# Patient Record
Sex: Male | Born: 1955 | Race: White | Hispanic: Yes | Marital: Single | Smoking: Former smoker
Health system: Southern US, Community
[De-identification: ages and names within clinical notes are randomized; demographics above are authoritative.]

## PROBLEM LIST (undated history)

## (undated) DIAGNOSIS — K219 Gastro-esophageal reflux disease without esophagitis: Secondary | ICD-10-CM

## (undated) HISTORY — PX: WRIST SURGERY: SHX841

---

## 2009-01-15 ENCOUNTER — Emergency Department (HOSPITAL_COMMUNITY): Admission: EM | Admit: 2009-01-15 | Discharge: 2009-01-15 | Payer: Self-pay | Admitting: Emergency Medicine

## 2009-01-19 ENCOUNTER — Emergency Department (HOSPITAL_COMMUNITY): Admission: EM | Admit: 2009-01-19 | Discharge: 2009-01-19 | Payer: Self-pay | Admitting: Emergency Medicine

## 2009-01-23 ENCOUNTER — Ambulatory Visit (HOSPITAL_BASED_OUTPATIENT_CLINIC_OR_DEPARTMENT_OTHER): Admission: RE | Admit: 2009-01-23 | Discharge: 2009-01-23 | Payer: Self-pay | Admitting: Orthopedic Surgery

## 2009-02-22 ENCOUNTER — Emergency Department (HOSPITAL_COMMUNITY): Admission: EM | Admit: 2009-02-22 | Discharge: 2009-02-22 | Payer: Self-pay | Admitting: Emergency Medicine

## 2010-07-11 LAB — POCT I-STAT, CHEM 8
BUN: 13 mg/dL (ref 6–23)
Calcium, Ion: 1.14 mmol/L (ref 1.12–1.32)
Glucose, Bld: 99 mg/dL (ref 70–99)
HCT: 43 % (ref 39.0–52.0)
Hemoglobin: 14.6 g/dL (ref 13.0–17.0)
TCO2: 23 mmol/L (ref 0–100)

## 2010-07-11 LAB — CBC
HCT: 41.8 % (ref 39.0–52.0)
Platelets: 143 10*3/uL — ABNORMAL LOW (ref 150–400)
WBC: 10.6 10*3/uL — ABNORMAL HIGH (ref 4.0–10.5)

## 2010-07-11 LAB — DIFFERENTIAL
Basophils Absolute: 0 10*3/uL (ref 0.0–0.1)
Eosinophils Absolute: 0.1 10*3/uL (ref 0.0–0.7)
Lymphocytes Relative: 23 % (ref 12–46)
Monocytes Absolute: 0.5 10*3/uL (ref 0.1–1.0)
Neutrophils Relative %: 71 % (ref 43–77)

## 2011-09-25 IMAGING — CT CT EXTREM UP W/O CM*L*
1 of 2 series · 5 of 14 positions shown, 7 images · non-contrast
Comparison: Left wrist radiographs done earlier the same date.

CLINICAL DATA: Fall today.  Evaluate distal radial fracture.

CT OF THE LEFT WRIST WITHOUT CONTRAST
TECHNIQUE: Multidetector CT imaging of the left wrist was
performed according to the standard protocol without intravenous
contrast. Multiplanar CT image reconstructions were also generated.

[Series 604: <mpr thick range(2)> · axial · 0.24mm/px · z∈[-138,-97]mm · 5 of 33 slices shown, 7 images]
[im 6/33  soft-tissue]
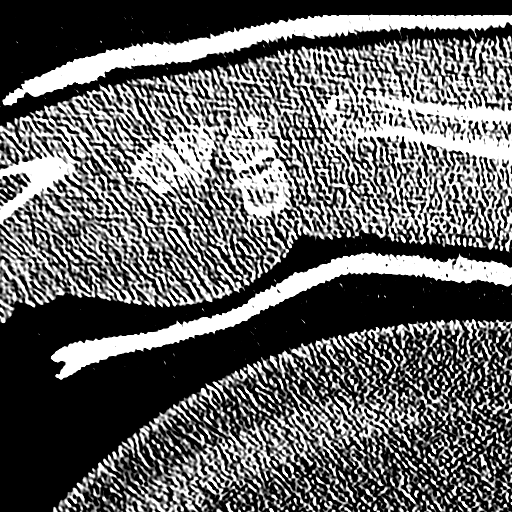
[im 6/33  bone]
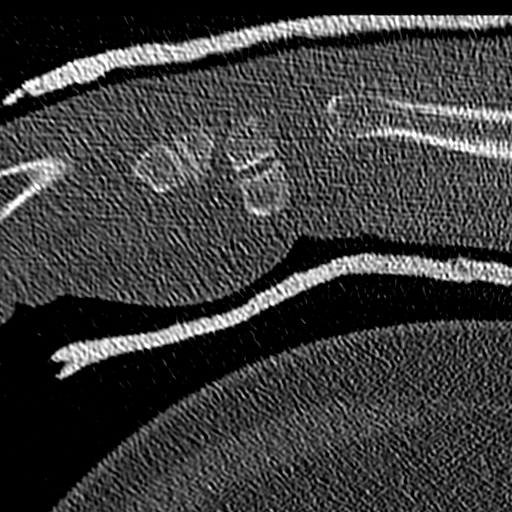
[im 11/33  bone]
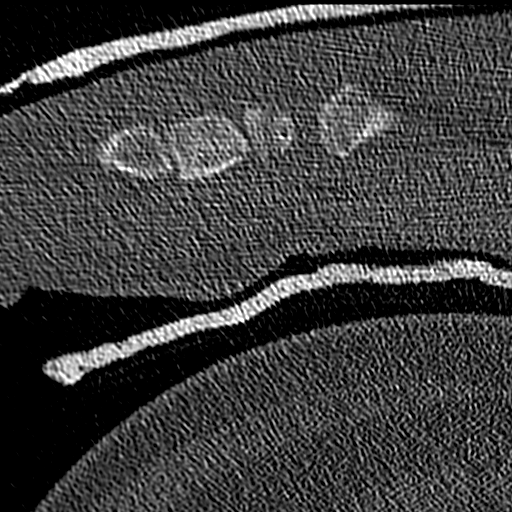
[im 17/33  bone]
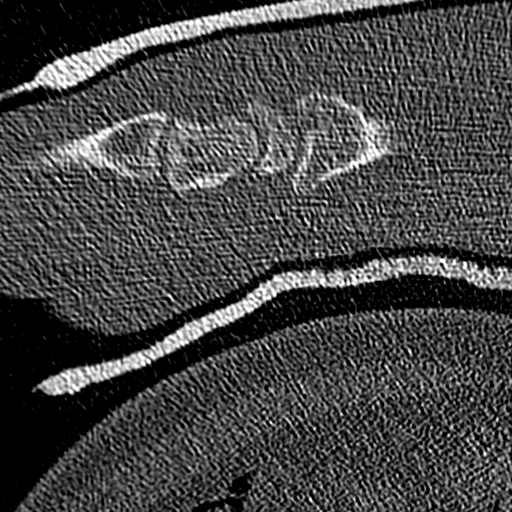
[im 22/33  bone]
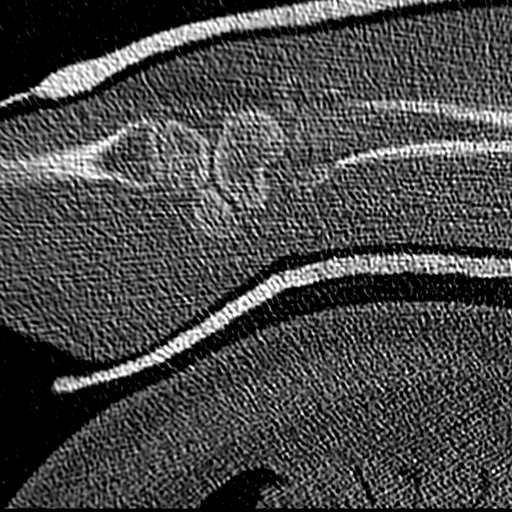
[im 27/33  soft-tissue]
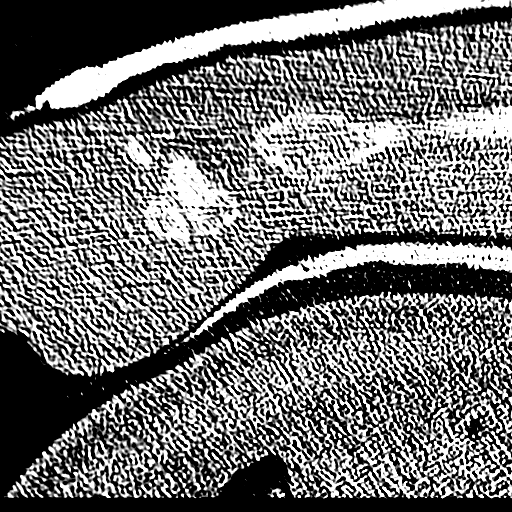
[im 27/33  bone]
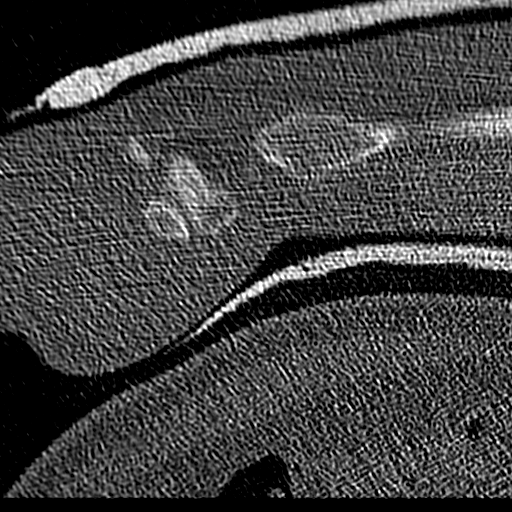

[5 of 14 positions shown; findings below may reference images not displayed]

FINDINGS: Study was initially performed with the patient's arm
resting over his upper abdomen.  Because those images were not
diagnostic, the patient was returned for a repeat study with the
patient prone and his arm properly positioned over his head.

A plaster splint has been applied.  There is a comminuted intra-
articular fracture of the distal radius with improved alignment
compared with the preceding radiographs.  The distal articular
surface of the radius demonstrates up to 3 mm of displacement at
the fracture.  Fracture extends into the distal radioulnar joint.
There is no significant widening of that joint.

Tiny ossific density is noted adjacent to the ulnar styloid which
may reflect a small avulsion fracture.  On the source images, there
is dorsal irregularity of the triquetrum suspicious for a small
avulsion fracture.  No other carpal bone fractures are identified.
IMPRESSION: 1.  Improved alignment of the intra-articular fracture of the
distal radius status post splinting.
2.  Possible small avulsion fractures from the dorsal aspect of the
triquetrum and the ulnar styloid.

## 2015-02-01 ENCOUNTER — Emergency Department (INDEPENDENT_AMBULATORY_CARE_PROVIDER_SITE_OTHER)
Admission: EM | Admit: 2015-02-01 | Discharge: 2015-02-01 | Disposition: A | Discharge status: Home / Self Care | Payer: Self-pay | Source: Home / Self Care | Attending: Emergency Medicine | Admitting: Emergency Medicine

## 2015-02-01 ENCOUNTER — Encounter (HOSPITAL_COMMUNITY): Payer: Self-pay | Admitting: Emergency Medicine

## 2015-02-01 DIAGNOSIS — K2970 Gastritis, unspecified, without bleeding: Secondary | ICD-10-CM

## 2015-02-01 DIAGNOSIS — K5901 Slow transit constipation: Secondary | ICD-10-CM

## 2015-02-01 MED ORDER — SUCRALFATE 1 G PO TABS: 1.0000 g | ORAL_TABLET | Freq: Three times a day (TID) | ORAL | Status: DC

## 2015-02-01 MED ORDER — OMEPRAZOLE 20 MG PO CPDR: 20.0000 mg | DELAYED_RELEASE_CAPSULE | Freq: Two times a day (BID) | ORAL | Status: DC

## 2015-02-01 MED ORDER — ONDANSETRON HCL 4 MG PO TABS: 4.0000 mg | ORAL_TABLET | Freq: Four times a day (QID) | ORAL | Status: DC

## 2015-02-01 NOTE — ED Notes (Signed)
Abdominal pain, intermittent for a week.  Pain is center epigastric area.  Patient has vomiting with pain, sometimes.  Patient reports a bm once a day.  Reports stools as sometimes hard and sometimes watery.  Denies urinary symptoms.

## 2015-02-01 NOTE — ED Provider Notes (Signed)
CSN: [id number]     Arrival date & time 02/01/15  1839 History   First MD Initiated Contact with Patient 02/01/15 1939     Chief Complaint  Patient presents with  . Abdominal Pain   (Consider location/radiation/quality/duration/timing/severity/associated sxs/prior Treatment) HPI Comments: 59 year old spanning male complaining of epigastric abdominal pain for one week. Pain is located in the mid epigastric region. It lasts between 15 and 30 minutes at a time. It is intermittent. It occurs approximately every 4-5 hours. Sometimes he has vomiting associated with the pain. Eating often makes it worse. Drinking milk often makes it better. He describes the pain as a burning type pain. He consumes beer 5-6 cans or bottles at a time however he only drinks once every 8-15 days or maybe 6 times a month.  Also complaining of constipation. Even though he had a small bowel movement today he has not had bowel movements other days. He has to strain and has small round hard stools.   History reviewed. No pertinent past medical history. History reviewed. No pertinent past surgical history. No family history on file. Social History  Substance Use Topics  . Smoking status: Current Every Day Smoker  . Smokeless tobacco: None  . Alcohol Use: No    Review of Systems  Constitutional: Negative for fever, activity change and fatigue.  HENT: Negative.   Respiratory: Negative.  Negative for cough and shortness of breath.   Cardiovascular: Negative for chest pain.  Gastrointestinal: Positive for nausea, vomiting, abdominal pain and constipation. Negative for diarrhea, blood in stool and anal bleeding.  Genitourinary: Negative.   Musculoskeletal: Negative.   Skin: Negative.   Neurological: Negative.   Psychiatric/Behavioral: Negative.     Allergies  Review of patient's allergies indicates no known allergies.  Home Medications   Prior to Admission medications   Medication Sig Start Date End Date Taking?  Authorizing Provider  omeprazole (PRILOSEC) 20 MG capsule Take 1 capsule (20 mg total) by mouth 2 (two) times daily. X 10 days 02/01/15   Hayden Rasmussenavid Zoiee Wimmer, NP  ondansetron (ZOFRAN) 4 MG tablet Take 1 tablet (4 mg total) by mouth every 6 (six) hours. 02/01/15   Hayden Rasmussenavid Kevon Tench, NP  sucralfate (CARAFATE) 1 G tablet Take 1 tablet (1 g total) by mouth 4 (four) times daily -  with meals and at bedtime. 02/01/15   Hayden Rasmussenavid Vallie Fayette, NP   Meds Ordered and Administered this Visit  Medications - No data to display  BP 110/78 mmHg  Pulse 75  Temp(Src) 98.7 F (37.1 C) (Oral)  Resp 20  SpO2 98% No data found.   Physical Exam  Constitutional: He is oriented to person, place, and time. He appears well-developed and well-nourished. No distress.  HENT:  Mouth/Throat: Oropharynx is clear and moist. No oropharyngeal exudate.  Eyes: EOM are normal.  Neck: Normal range of motion. Neck supple.  Cardiovascular: Normal rate.   Pulmonary/Chest: Effort normal and breath sounds normal. No respiratory distress.  Abdominal: Soft. Bowel sounds are normal. He exhibits no distension and no mass. There is tenderness. There is no rebound and no guarding.  Epigastric tenderness  Musculoskeletal: He exhibits no edema.  Lymphadenopathy:    He has no cervical adenopathy.  Neurological: He is alert and oriented to person, place, and time. No cranial nerve deficit. He exhibits normal muscle tone. Coordination normal.  Skin: Skin is warm and dry. No rash noted. No erythema.  Psychiatric: He has a normal mood and affect.  Nursing note and vitals reviewed.  ED Course  Procedures (including critical care time)  Labs Review Labs Reviewed - No data to display  Imaging Review No results found.   Visual Acuity Review  Right Eye Distance:   Left Eye Distance:   Bilateral Distance:    Right Eye Near:   Left Eye Near:    Bilateral Near:         MDM   1. Gastritis   2. Slow transit constipation   No evidence of acute  abdomen.  zofran for nausea carafate ac and hs Omeprazole 40 mg a day Zantac 150 mg q hs No ETOH No fried or spicey food for 1 week Miralax powder 1 capful in 6-8 oz water at night, every night until have good BM. Lots of fluids Increase fiber in diet     Hayden Rasmussen, NP 02/01/15 2004

## 2015-02-01 NOTE — Discharge Instructions (Signed)
Constipation, Adult Miralax powder 1 capful in 6-8 oz water at night, every night until have good BM. Lots of fluids Increase fiber in diet Constipation is when a person has fewer than three bowel movements a week, has difficulty having a bowel movement, or has stools that are dry, hard, or larger than normal. As people grow older, constipation is more common. A low-fiber diet, not taking in enough fluids, and taking certain medicines may make constipation worse.  CAUSES   Certain medicines, such as antidepressants, pain medicine, iron supplements, antacids, and water pills.   Certain diseases, such as diabetes, irritable bowel syndrome (IBS), thyroid disease, or depression.   Not drinking enough water.   Not eating enough fiber-rich foods.   Stress or travel.   Lack of physical activity or exercise.   Ignoring the urge to have a bowel movement.   Using laxatives too much.  SIGNS AND SYMPTOMS   Having fewer than three bowel movements a week.   Straining to have a bowel movement.   Having stools that are hard, dry, or larger than normal.   Feeling full or bloated.   Pain in the lower abdomen.   Not feeling relief after having a bowel movement.  DIAGNOSIS  Your health care provider will take a medical history and perform a physical exam. Further testing may be done for severe constipation. Some tests may include:  A barium enema X-ray to examine your rectum, colon, and, sometimes, your small intestine.   A sigmoidoscopy to examine your lower colon.   A colonoscopy to examine your entire colon. TREATMENT  Treatment will depend on the severity of your constipation and what is causing it. Some dietary treatments include drinking more fluids and eating more fiber-rich foods. Lifestyle treatments may include regular exercise. If these diet and lifestyle recommendations do not help, your health care provider may recommend taking over-the-counter laxative medicines  to help you have bowel movements. Prescription medicines may be prescribed if over-the-counter medicines do not work.  HOME CARE INSTRUCTIONS   Eat foods that have a lot of fiber, such as fruits, vegetables, whole grains, and beans.  Limit foods high in fat and processed sugars, such as french fries, hamburgers, cookies, candies, and soda.   A fiber supplement may be added to your diet if you cannot get enough fiber from foods.   Drink enough fluids to keep your urine clear or pale yellow.   Exercise regularly or as directed by your health care provider.   Go to the restroom when you have the urge to go. Do not hold it.   Only take over-the-counter or prescription medicines as directed by your health care provider. Do not take other medicines for constipation without talking to your health care provider first.  SEEK IMMEDIATE MEDICAL CARE IF:   You have bright red blood in your stool.   Your constipation lasts for more than 4 days or gets worse.   You have abdominal or rectal pain.   You have thin, pencil-like stools.   You have unexplained weight loss. MAKE SURE YOU:   Understand these instructions.  Will watch your condition.  Will get help right away if you are not doing well or get worse.   This information is not intended to replace advice given to you by your health care provider. Make sure you discuss any questions you have with your health care provider.   Document Released: 12/21/2003 Document Revised: 04/14/2014 Document Reviewed: 01/03/2013 Elsevier Interactive Patient Education 2016  Elsevier Inc.  Gastritis, Adult zofran for nausea carafate ac and hs Omeprazole 40 mg a day Zantac 150 mg q hs No ETOH No fried or spicey food for 1 week  Gastritis is soreness and swelling (inflammation) of the lining of the stomach. Gastritis can develop as a sudden onset (acute) or long-term (chronic) condition. If gastritis is not treated, it can lead to stomach  bleeding and ulcers. CAUSES  Gastritis occurs when the stomach lining is weak or damaged. Digestive juices from the stomach then inflame the weakened stomach lining. The stomach lining may be weak or damaged due to viral or bacterial infections. One common bacterial infection is the Helicobacter pylori infection. Gastritis can also result from excessive alcohol consumption, taking certain medicines, or having too much acid in the stomach.  SYMPTOMS  In some cases, there are no symptoms. When symptoms are present, they may include:  Pain or a burning sensation in the upper abdomen.  Nausea.  Vomiting.  An uncomfortable feeling of fullness after eating. DIAGNOSIS  Your caregiver may suspect you have gastritis based on your symptoms and a physical exam. To determine the cause of your gastritis, your caregiver may perform the following:  Blood or stool tests to check for the H pylori bacterium.  Gastroscopy. A thin, flexible tube (endoscope) is passed down the esophagus and into the stomach. The endoscope has a light and camera on the end. Your caregiver uses the endoscope to view the inside of the stomach.  Taking a tissue sample (biopsy) from the stomach to examine under a microscope. TREATMENT  Depending on the cause of your gastritis, medicines may be prescribed. If you have a bacterial infection, such as an H pylori infection, antibiotics may be given. If your gastritis is caused by too much acid in the stomach, H2 blockers or antacids may be given. Your caregiver may recommend that you stop taking aspirin, ibuprofen, or other nonsteroidal anti-inflammatory drugs (NSAIDs). HOME CARE INSTRUCTIONS  Only take over-the-counter or prescription medicines as directed by your caregiver.  If you were given antibiotic medicines, take them as directed. Finish them even if you start to feel better.  Drink enough fluids to keep your urine clear or pale yellow.  Avoid foods and drinks that make  your symptoms worse, such as:  Caffeine or alcoholic drinks.  Chocolate.  Peppermint or mint flavorings.  Garlic and onions.  Spicy foods.  Citrus fruits, such as oranges, lemons, or limes.  Tomato-based foods such as sauce, chili, salsa, and pizza.  Fried and fatty foods.  Eat small, frequent meals instead of large meals. SEEK IMMEDIATE MEDICAL CARE IF:   You have black or dark red stools.  You vomit blood or material that looks like coffee grounds.  You are unable to keep fluids down.  Your abdominal pain gets worse.  You have a fever.  You do not feel better after 1 week.  You have any other questions or concerns. MAKE SURE YOU:  Understand these instructions.  Will watch your condition.  Will get help right away if you are not doing well or get worse.   This information is not intended to replace advice given to you by your health care provider. Make sure you discuss any questions you have with your health care provider.   Document Released: 03/18/2001 Document Revised: 09/23/2011 Document Reviewed: 05/07/2011 Elsevier Interactive Patient Education 2016 Elsevier Inc.  High-Fiber Diet Fiber, also called dietary fiber, is a type of carbohydrate found in fruits, vegetables, whole  grains, and beans. A high-fiber diet can have many health benefits. Your health care provider may recommend a high-fiber diet to help:  Prevent constipation. Fiber can make your bowel movements more regular.  Lower your cholesterol.  Relieve hemorrhoids, uncomplicated diverticulosis, or irritable bowel syndrome.  Prevent overeating as part of a weight-loss plan.  Prevent heart disease, type 2 diabetes, and certain cancers. WHAT IS MY PLAN? The recommended daily intake of fiber includes:  38 grams for men under age 34.  30 grams for men over age 31.  25 grams for women under age 42.  21 grams for women over age 36. You can get the recommended daily intake of dietary fiber  by eating a variety of fruits, vegetables, grains, and beans. Your health care provider may also recommend a fiber supplement if it is not possible to get enough fiber through your diet. WHAT DO I NEED TO KNOW ABOUT A HIGH-FIBER DIET?  Fiber supplements have not been widely studied for their effectiveness, so it is better to get fiber through food sources.  Always check the fiber content on thenutrition facts label of any prepackaged food. Look for foods that contain at least 5 grams of fiber per serving.  Ask your dietitian if you have questions about specific foods that are related to your condition, especially if those foods are not listed in the following section.  Increase your daily fiber consumption gradually. Increasing your intake of dietary fiber too quickly may cause bloating, cramping, or gas.  Drink plenty of water. Water helps you to digest fiber. WHAT FOODS CAN I EAT? Grains Whole-grain breads. Multigrain cereal. Oats and oatmeal. Brown rice. Barley. Bulgur wheat. Millet. Bran muffins. Popcorn. Rye wafer crackers. Vegetables Sweet potatoes. Spinach. Kale. Artichokes. Cabbage. Broccoli. Green peas. Carrots. Squash. Fruits Berries. Pears. Apples. Oranges. Avocados. Prunes and raisins. Dried figs. Meats and Other Protein Sources Navy, kidney, pinto, and soy beans. Split peas. Lentils. Nuts and seeds. Dairy Fiber-fortified yogurt. Beverages Fiber-fortified soy milk. Fiber-fortified orange juice. Other Fiber bars. The items listed above may not be a complete list of recommended foods or beverages. Contact your dietitian for more options. WHAT FOODS ARE NOT RECOMMENDED? Grains White bread. Pasta made with refined flour. White rice. Vegetables Fried potatoes. Canned vegetables. Well-cooked vegetables.  Fruits Fruit juice. Cooked, strained fruit. Meats and Other Protein Sources Fatty cuts of meat. Fried Environmental education officer or fried fish. Dairy Milk. Yogurt. Cream cheese. Sour  cream. Beverages Soft drinks. Other Cakes and pastries. Butter and oils. The items listed above may not be a complete list of foods and beverages to avoid. Contact your dietitian for more information. WHAT ARE SOME TIPS FOR INCLUDING HIGH-FIBER FOODS IN MY DIET?  Eat a wide variety of high-fiber foods.  Make sure that half of all grains consumed each day are whole grains.  Replace breads and cereals made from refined flour or white flour with whole-grain breads and cereals.  Replace white rice with brown rice, bulgur wheat, or millet.  Start the day with a breakfast that is high in fiber, such as a cereal that contains at least 5 grams of fiber per serving.  Use beans in place of meat in soups, salads, or pasta.  Eat high-fiber snacks, such as berries, raw vegetables, nuts, or popcorn.   This information is not intended to replace advice given to you by your health care provider. Make sure you discuss any questions you have with your health care provider.   Document Released: 03/24/2005 Document Revised:  04/14/2014 Document Reviewed: 09/06/2013 Elsevier Interactive Patient Education [employer] Elsevier Inc.

## 2015-06-14 ENCOUNTER — Emergency Department (INDEPENDENT_AMBULATORY_CARE_PROVIDER_SITE_OTHER)
Admission: EM | Admit: 2015-06-14 | Discharge: 2015-06-14 | Disposition: A | Discharge status: Home / Self Care | Payer: Self-pay | Source: Home / Self Care | Attending: Emergency Medicine | Admitting: Emergency Medicine

## 2015-06-14 ENCOUNTER — Encounter (HOSPITAL_COMMUNITY): Payer: Self-pay | Admitting: Emergency Medicine

## 2015-06-14 DIAGNOSIS — K2970 Gastritis, unspecified, without bleeding: Principal | ICD-10-CM

## 2015-06-14 DIAGNOSIS — B9681 Helicobacter pylori [H. pylori] as the cause of diseases classified elsewhere: Secondary | ICD-10-CM

## 2015-06-14 DIAGNOSIS — K5900 Constipation, unspecified: Secondary | ICD-10-CM

## 2015-06-14 LAB — POCT I-STAT, CHEM 8
BUN: 9 mg/dL (ref 6–20)
CREATININE: 0.8 mg/dL (ref 0.61–1.24)
Calcium, Ion: 1.21 mmol/L (ref 1.12–1.23)
Chloride: 103 mmol/L (ref 101–111)
GLUCOSE: 99 mg/dL (ref 65–99)
HEMATOCRIT: 48 % (ref 39.0–52.0)
HEMOGLOBIN: 16.3 g/dL (ref 13.0–17.0)
POTASSIUM: 4.2 mmol/L (ref 3.5–5.1)
Sodium: 143 mmol/L (ref 135–145)
TCO2: 28 mmol/L (ref 0–100)

## 2015-06-14 LAB — POCT URINALYSIS DIP (DEVICE)
BILIRUBIN URINE: NEGATIVE
Glucose, UA: NEGATIVE mg/dL
HGB URINE DIPSTICK: NEGATIVE
Ketones, ur: NEGATIVE mg/dL
LEUKOCYTES UA: NEGATIVE
Nitrite: NEGATIVE
PH: 5.5 (ref 5.0–8.0)
Protein, ur: NEGATIVE mg/dL
Urobilinogen, UA: 0.2 mg/dL (ref 0.0–1.0)

## 2015-06-14 LAB — POCT H PYLORI SCREEN: H. PYLORI SCREEN, POC: POSITIVE — AB

## 2015-06-14 MED ORDER — CLARITHROMYCIN 500 MG PO TABS: 500.0000 mg | ORAL_TABLET | Freq: Two times a day (BID) | ORAL | Status: DC

## 2015-06-14 MED ORDER — AMOXICILLIN 500 MG PO CAPS: 1000.0000 mg | ORAL_CAPSULE | Freq: Two times a day (BID) | ORAL | Status: DC

## 2015-06-14 MED ORDER — DOCUSATE SODIUM 100 MG PO CAPS: 100.0000 mg | ORAL_CAPSULE | Freq: Every day | ORAL | Status: DC

## 2015-06-14 MED ORDER — OMEPRAZOLE 40 MG PO CPDR: 40.0000 mg | DELAYED_RELEASE_CAPSULE | Freq: Every day | ORAL | Status: DC

## 2015-06-14 NOTE — Discharge Instructions (Signed)
Your stomach pain is coming from an infection called H. Pylori. Take the omeprazole, amoxicillin, and clarithromycin as prescribed. You should see improvement when you finish the medicines.  Take Colace daily to help with your constipation. This is likely the reason you have some trouble with urinating.  Follow-up in 2 weeks if things are not improving.  Su dolor de Teaching laboratory technicianestmago viene de una infeccin llamada H. Pylori. Tome el omeprazol, la amoxicilina y Facilities managerla claritromicina segn lo prescrito. Usted debe ver una mejora cuando termine los medicamentos.  Tome Colace diariamente para ayudar con su estreimiento. Esta es probablemente la razn por la que tiene algunos problemas para Geographical information systems officerorinar.  Seguimiento en 2 semanas si las cosas no estn mejorando.

## 2015-06-14 NOTE — ED Provider Notes (Signed)
CSN: [id number]     Arrival date & time 06/14/15  1736 History   First MD Initiated Contact with Patient 06/14/15 1800     Chief Complaint  Patient presents with  . Abdominal Pain   (Consider location/radiation/quality/duration/timing/severity/associated sxs/prior Treatment) HPI  He is a 60 year old man here for evaluation of abdominal pain. The phone interpreter was used for this encounter. He states he is having epigastric abdominal pain. This comes and goes. Some days it'll be present for 6 hours, other days not at all. It tends to get worse with food. He does report some nausea and vomiting. He states the vomit is mostly yellow to greenish, but sometimes does have some red strings in it. He denies any diarrhea usually. He states he is more often constipated. He did take medication for constipation today which is causing him to have some diarrhea. He denies any blood in the stool or dark tarry stools. He does have a history of gastritis. He used to take medicine for his stomach, but he states it didn't help that much. He is not currently taking any medicines. No fevers, but he does report some fatigue.  History reviewed. No pertinent past medical history. History reviewed. No pertinent past surgical history. History reviewed. No pertinent family history. Social History  Substance Use Topics  . Smoking status: Current Every Day Smoker  . Smokeless tobacco: None  . Alcohol Use: No    Review of Systems As in history of present illness Also states he sometimes has difficulty urinating. Allergies  Review of patient's allergies indicates no known allergies.  Home Medications   Prior to Admission medications   Medication Sig Start Date End Date Taking? Authorizing Provider  amoxicillin (AMOXIL) 500 MG capsule Take 2 capsules (1,000 mg total) by mouth 2 (two) times daily. 06/14/15   Charm RingsErin J Jensyn Cambria, MD  clarithromycin (BIAXIN) 500 MG tablet Take 1 tablet (500 mg total) by mouth 2 (two) times  daily. 06/14/15   Charm RingsErin J Destyn Parfitt, MD  docusate sodium (COLACE) 100 MG capsule Take 1 capsule (100 mg total) by mouth daily. 06/14/15   Charm RingsErin J Tikisha Molinaro, MD  omeprazole (PRILOSEC) 40 MG capsule Take 1 capsule (40 mg total) by mouth daily. 06/14/15   Charm RingsErin J Simran Bomkamp, MD   Meds Ordered and Administered this Visit  Medications - No data to display  BP 101/73 mmHg  Pulse 71  Temp(Src) 98.4 F (36.9 C) (Oral)  Resp 16  SpO2 97% No data found.   Physical Exam  Constitutional: He is oriented to person, place, and time. He appears well-developed and well-nourished.  Neck: Neck supple.  Cardiovascular: Normal rate, regular rhythm and normal heart sounds.   No murmur heard. Pulmonary/Chest: Effort normal. No respiratory distress. He has no wheezes. He has no rales.  Abdominal: Soft. Bowel sounds are normal. He exhibits no distension and no mass. There is tenderness (in epigastric). There is no rebound and no guarding.  Genitourinary: Prostate normal.  He does have a small external hemorrhoid  Neurological: He is alert and oriented to person, place, and time.    ED Course  Procedures (including critical care time)  Labs Review Labs Reviewed  POCT H PYLORI SCREEN - Abnormal; Notable for the following:    H. PYLORI SCREEN, POC POSITIVE (*)    All other components within normal limits  POCT I-STAT, CHEM 8  POCT URINALYSIS DIP (DEVICE)    Imaging Review No results found.    MDM   1. Helicobacter pylori  gastritis   2. Constipation, unspecified constipation type    Treat with triple therapy. Colace for constipation. I suspect the constipation is leading to intermittent difficulties with urination. Follow-up if not improving in 2 weeks.    Charm Rings, MD 06/14/15 [phone]

## 2015-06-14 NOTE — ED Notes (Signed)
[facility] a [facility] right abdominal pain x 1 week.

## 2016-12-30 ENCOUNTER — Encounter (HOSPITAL_COMMUNITY): Payer: Self-pay | Admitting: Emergency Medicine

## 2016-12-30 ENCOUNTER — Ambulatory Visit (HOSPITAL_COMMUNITY)
Admission: EM | Admit: 2016-12-30 | Discharge: 2016-12-30 | Disposition: A | Discharge status: Home / Self Care | Payer: Self-pay | Attending: Internal Medicine | Admitting: Internal Medicine

## 2016-12-30 ENCOUNTER — Ambulatory Visit (INDEPENDENT_AMBULATORY_CARE_PROVIDER_SITE_OTHER): Payer: Self-pay

## 2016-12-30 DIAGNOSIS — K5900 Constipation, unspecified: Secondary | ICD-10-CM

## 2016-12-30 DIAGNOSIS — R1084 Generalized abdominal pain: Secondary | ICD-10-CM

## 2016-12-30 MED ORDER — DOCUSATE SODIUM 50 MG PO CAPS: 50.0000 mg | ORAL_CAPSULE | Freq: Two times a day (BID) | ORAL | 0 refills | Status: AC

## 2016-12-30 MED ORDER — POLYETHYLENE GLYCOL 3350 17 G PO PACK: 17.0000 g | PACK | Freq: Every day | ORAL | 0 refills | Status: DC

## 2016-12-30 NOTE — Discharge Instructions (Signed)
Start colace and miralax for constipation. Keep hydrated, you urine should be clear to pale yellow in color. Follow up with PCP for further evaluation and treatment of epigastric pain. You were tested positive 1 year ago for h pylori infection, PCP can help determine if this infection is active or not. Further evaluation, such as endoscopy, where they look into your stomach can also be scheduled by PCP if needed. You are also due for colonoscopy, which screens for colon cancer, and this can be scheduled by the PCP. If experiencing worsening symptoms, vomiting with blood, weakness, dizziness, go to the emergency department for further evaluation.

## 2016-12-30 NOTE — ED Provider Notes (Signed)
[facility]    CSN: [id number] Arrival date & time: 12/30/16  1010     History   Chief Complaint Chief Complaint  Patient presents with  . Abdominal Pain    HPI Justin Ballard is a 61 y.o. male.   61 year old male comes in for epigastric pain. He was treated for h pylori 1 year ago, which helped with the symptoms, but symptoms came back 8 days ago. He has been having epigastric abdominal pain, that radiates to the lower abdomen. Pain is exacerbated with eating, which causes him to vomit. But pain can come on without food intake, usually lasting for about an hour. Denies blood in vomit. Describes the pain as dull/sharp. He also feels that he has had times having trouble with bowel movements. States has hard stools with straining. He states he has BM daily, last one yesterday with straining, denies blood in stool. Denies fever. He has tried milk for his symptoms without relief. Never had a colonoscopy. Denies family history of colon cancer. Denies history of abdominal surgeries.         History reviewed. No pertinent past medical history.  There are no active problems to display for this patient.   History reviewed. No pertinent surgical history.     Home Medications    Prior to Admission medications   Medication Sig Start Date End Date Taking? Authorizing Provider  docusate sodium (COLACE) 50 MG capsule Take 1 capsule (50 mg total) by mouth 2 (two) times daily. 12/30/16   Cathie Hoops, Amy V, PA-C  polyethylene glycol (MIRALAX) packet Take 17 g by mouth daily. 12/30/16   Belinda Fisher, PA-C    Family History History reviewed. No pertinent family history.  Social History Social History  Substance Use Topics  . Smoking status: Current Every Day Smoker  . Smokeless tobacco: Never Used  . Alcohol use No     Allergies   Patient has no known allergies.   Review of Systems Review of Systems  Reason unable to perform ROS: See HPI as above.     Physical  Exam Triage Vital Signs ED Triage Vitals [12/30/16 1043]  Enc Vitals Group     BP 129/78     Pulse Rate (!) 56     Resp      Temp 97.8 F (36.6 C)     Temp Source Oral     SpO2 98 %     Weight      Height      Head Circumference      Peak Flow      Pain Score      Pain Loc      Pain Edu?      Excl. in [facility]?    No data found.   Updated Vital Signs BP 129/78 (BP Location: Left Arm)   Pulse (!) 56   Temp 97.8 F (36.6 C) (Oral)   SpO2 98%   Physical Exam  Constitutional: He is oriented to person, place, and time. He appears well-developed and well-nourished. No distress.  HENT:  Head: Normocephalic and atraumatic.  Eyes: Pupils are equal, round, and reactive to light. Conjunctivae are normal.  Neck: Normal range of motion. Neck supple.  Cardiovascular: Normal rate, regular rhythm and normal heart sounds.  Exam reveals no gallop and no friction rub.   No murmur heard. Pulmonary/Chest: Effort normal and breath sounds normal. He has no wheezes. He has no rales.  Abdominal: Soft. Bowel sounds are normal. He  exhibits no distension. There is tenderness (mild generlized tenderness). There is no rebound and no guarding.  Neurological: He is alert and oriented to person, place, and time.     UC Treatments / Results  Labs (all labs ordered are listed, but only abnormal results are displayed) Labs Reviewed - No data to display  EKG  EKG Interpretation None       Radiology Dg Abd 1 View  Result Date: 12/30/2016 CLINICAL DATA:  Abdominal pain. EXAM: ABDOMEN - 1 VIEW COMPARISON:  No recent prior. FINDINGS: Soft tissue structures are unremarkable. No bowel distention. No free air. No acute bony abnormality. Small calcific density noted adjacent to the left L4 vertebral body. This may be related to adjacent lumbar spine degenerative disease. Small left ureteral stone cannot be excluded. Diffuse prominent degenerative change lumbar spine. No acute bony abnormality. IMPRESSION: 1.   No bowel distention or free air. 2. Small calcific density noted adjacent to the left portion of L4, possibly related to degenerative disease. Small left ureteral stone cannot be excluded. Electronically Signed   By: Maisie Fus  Register   On: 12/30/2016 11:28    Procedures Procedures (including critical care time)  Medications Ordered in UC Medications - No data to display   Initial Impression / Assessment and Plan / UC Course  I have reviewed the triage vital signs and the nursing notes.  Pertinent labs & imaging results that were available during my care of the patient were reviewed by me and considered in my medical decision making (see chart for details).  Clinical Course as of Dec 30 1429  Tue Dec 30, 2016  1114 Patient requesting imaging. Discussed with patient risks/benefits of KUB. Patient would like to proceed with KUB.  [AY]  1139 DG Abd 1 View [AY]    Clinical Course User Index [AY] Belinda Fisher, PA-C    Discussed xray results with patient. Start colace and miralax for constipation. Encouraged establishment with PCP for possible h pylori reinfection and colonoscopy needed. Resources given to patient. Return precautions given.   Final Clinical Impressions(s) / UC Diagnoses   Final diagnoses:  Generalized abdominal pain  Constipation, unspecified constipation type    New Prescriptions Discharge Medication List as of 12/30/2016 12:01 PM    START taking these medications   Details  polyethylene glycol (MIRALAX) packet Take 17 g by mouth daily., Starting Tue 12/30/2016, Normal           Linward Headland V, PA-C 12/30/16 1431

## 2016-12-30 NOTE — ED Triage Notes (Signed)
Pt reports abdominal pain that starts in the epigastric area and spreads to his lower abdomen x1 month.  Due to language barrier, pt unable to describe the pain.  We will use an interpreter with the provider to get a more detailed history.

## 2016-12-31 ENCOUNTER — Encounter (HOSPITAL_COMMUNITY): Payer: Self-pay

## 2016-12-31 ENCOUNTER — Inpatient Hospital Stay (HOSPITAL_COMMUNITY)
Admission: EM | Admit: 2016-12-31 | Discharge: 2017-01-03 | DRG: 379 | Disposition: A | Discharge status: Home / Self Care | Payer: Self-pay | Attending: Family Medicine | Admitting: Family Medicine

## 2016-12-31 DIAGNOSIS — D649 Anemia, unspecified: Secondary | ICD-10-CM | POA: Diagnosis present

## 2016-12-31 DIAGNOSIS — I959 Hypotension, unspecified: Secondary | ICD-10-CM | POA: Diagnosis present

## 2016-12-31 DIAGNOSIS — K269 Duodenal ulcer, unspecified as acute or chronic, without hemorrhage or perforation: Secondary | ICD-10-CM | POA: Diagnosis present

## 2016-12-31 DIAGNOSIS — Z789 Other specified health status: Secondary | ICD-10-CM | POA: Diagnosis present

## 2016-12-31 DIAGNOSIS — E876 Hypokalemia: Secondary | ICD-10-CM | POA: Diagnosis present

## 2016-12-31 DIAGNOSIS — K920 Hematemesis: Secondary | ICD-10-CM | POA: Diagnosis present

## 2016-12-31 DIAGNOSIS — K219 Gastro-esophageal reflux disease without esophagitis: Secondary | ICD-10-CM | POA: Diagnosis present

## 2016-12-31 DIAGNOSIS — R079 Chest pain, unspecified: Secondary | ICD-10-CM

## 2016-12-31 DIAGNOSIS — N289 Disorder of kidney and ureter, unspecified: Secondary | ICD-10-CM

## 2016-12-31 DIAGNOSIS — D500 Iron deficiency anemia secondary to blood loss (chronic): Secondary | ICD-10-CM | POA: Diagnosis present

## 2016-12-31 DIAGNOSIS — F17210 Nicotine dependence, cigarettes, uncomplicated: Secondary | ICD-10-CM | POA: Diagnosis present

## 2016-12-31 DIAGNOSIS — K922 Gastrointestinal hemorrhage, unspecified: Secondary | ICD-10-CM | POA: Diagnosis present

## 2016-12-31 DIAGNOSIS — K921 Melena: Principal | ICD-10-CM | POA: Diagnosis present

## 2016-12-31 DIAGNOSIS — R739 Hyperglycemia, unspecified: Secondary | ICD-10-CM | POA: Diagnosis present

## 2016-12-31 DIAGNOSIS — Z79899 Other long term (current) drug therapy: Secondary | ICD-10-CM

## 2016-12-31 HISTORY — DX: Gastro-esophageal reflux disease without esophagitis: K21.9

## 2016-12-31 LAB — I-STAT CHEM 8, ED
BUN: 49 mg/dL — AB (ref 6–20)
CHLORIDE: 101 mmol/L (ref 101–111)
Calcium, Ion: 1.11 mmol/L — ABNORMAL LOW (ref 1.15–1.40)
Creatinine, Ser: 1.2 mg/dL (ref 0.61–1.24)
Glucose, Bld: 171 mg/dL — ABNORMAL HIGH (ref 65–99)
HEMATOCRIT: 35 % — AB (ref 39.0–52.0)
Hemoglobin: 11.9 g/dL — ABNORMAL LOW (ref 13.0–17.0)
Potassium: 3.4 mmol/L — ABNORMAL LOW (ref 3.5–5.1)
SODIUM: 140 mmol/L (ref 135–145)
TCO2: 24 mmol/L (ref 22–32)

## 2016-12-31 LAB — COMPREHENSIVE METABOLIC PANEL
ALT: 18 U/L (ref 17–63)
ANION GAP: 9 (ref 5–15)
AST: 23 U/L (ref 15–41)
Albumin: 3.3 g/dL — ABNORMAL LOW (ref 3.5–5.0)
Alkaline Phosphatase: 65 U/L (ref 38–126)
BUN: 52 mg/dL — ABNORMAL HIGH (ref 6–20)
CHLORIDE: 104 mmol/L (ref 101–111)
CO2: 25 mmol/L (ref 22–32)
Calcium: 8.6 mg/dL — ABNORMAL LOW (ref 8.9–10.3)
Creatinine, Ser: 1.29 mg/dL — ABNORMAL HIGH (ref 0.61–1.24)
GFR, EST NON AFRICAN AMERICAN: 59 mL/min — AB (ref >60)
Glucose, Bld: 166 mg/dL — ABNORMAL HIGH (ref 65–99)
Potassium: 3.5 mmol/L (ref 3.5–5.1)
SODIUM: 138 mmol/L (ref 135–145)
Total Bilirubin: 0.9 mg/dL (ref 0.3–1.2)
Total Protein: 6.3 g/dL — ABNORMAL LOW (ref 6.5–8.1)

## 2016-12-31 LAB — CBC
HCT: 35.1 % — ABNORMAL LOW (ref 39.0–52.0)
HEMOGLOBIN: 12.2 g/dL — AB (ref 13.0–17.0)
MCH: 31.4 pg (ref 26.0–34.0)
MCHC: 34.8 g/dL (ref 30.0–36.0)
MCV: 90.5 fL (ref 78.0–100.0)
PLATELETS: 182 10*3/uL (ref 150–400)
RBC: 3.88 MIL/uL — AB (ref 4.22–5.81)
RDW: 13 % (ref 11.5–15.5)
WBC: 8.6 10*3/uL (ref 4.0–10.5)

## 2016-12-31 LAB — I-STAT CG4 LACTIC ACID, ED
LACTIC ACID, VENOUS: 0.96 mmol/L (ref 0.5–1.9)
Lactic Acid, Venous: 1.58 mmol/L (ref 0.5–1.9)

## 2016-12-31 LAB — PREPARE RBC (CROSSMATCH)

## 2016-12-31 LAB — PROTIME-INR
INR: 1.09
Prothrombin Time: 14 seconds (ref 11.4–15.2)

## 2016-12-31 LAB — LIPASE, BLOOD: LIPASE: 35 U/L (ref 11–51)

## 2016-12-31 LAB — ABO/RH: ABO/RH(D): O POS

## 2016-12-31 LAB — POC OCCULT BLOOD, ED: Fecal Occult Bld: POSITIVE — AB

## 2016-12-31 MED ORDER — SODIUM CHLORIDE 0.9 % IV BOLUS (SEPSIS)
1000.0000 mL | Freq: Once | INTRAVENOUS | Status: AC
  Administered 2016-12-31: 1000 mL via INTRAVENOUS

## 2016-12-31 MED ORDER — POTASSIUM CHLORIDE IN NACL 20-0.9 MEQ/L-% IV SOLN
INTRAVENOUS | Status: AC
  Administered 2017-01-01: 01:00:00 via INTRAVENOUS
  Filled 2016-12-31 (×2): qty 1000

## 2016-12-31 MED ORDER — PANTOPRAZOLE SODIUM 40 MG IV SOLR: 40.0000 mg | Freq: Two times a day (BID) | INTRAVENOUS | Status: DC

## 2016-12-31 MED ORDER — ACETAMINOPHEN 325 MG PO TABS
650.0000 mg | ORAL_TABLET | Freq: Four times a day (QID) | ORAL | Status: DC | PRN
  Administered 2017-01-01 – 2017-01-02 (×2): 650 mg via ORAL
  Filled 2016-12-31 (×2): qty 2

## 2016-12-31 MED ORDER — ACETAMINOPHEN 650 MG RE SUPP: 650.0000 mg | Freq: Four times a day (QID) | RECTAL | Status: DC | PRN

## 2016-12-31 MED ORDER — SODIUM CHLORIDE 0.9 % IV SOLN
80.0000 mg | Freq: Once | INTRAVENOUS | Status: AC
  Administered 2016-12-31: 19:00:00 80 mg via INTRAVENOUS
  Filled 2016-12-31: qty 80

## 2016-12-31 MED ORDER — SODIUM CHLORIDE 0.9 % IV SOLN
Freq: Once | INTRAVENOUS | Status: AC
Start: 2016-12-31 — End: 2016-12-31
  Administered 2016-12-31: 10 mL/h via INTRAVENOUS

## 2016-12-31 MED ORDER — SODIUM CHLORIDE 0.9 % IV SOLN
8.0000 mg/h | INTRAVENOUS | Status: DC
  Administered 2016-12-31 – 2017-01-02 (×3): 8 mg/h via INTRAVENOUS
  Filled 2016-12-31 (×7): qty 80

## 2016-12-31 NOTE — ED Triage Notes (Signed)
Pt BIB [facility] from home where pt was found lying on floor by sister "covered in blood". Pt presents with blood on pants. EMS reports pt hypotensive on arrival in the 80s.

## 2016-12-31 NOTE — H&P (Addendum)
[facility] H&P   Patient Demographics:    Cecilio Ohlrich, is a 61 y.o. male  MRN: [id number]   DOB - 1955/12/05  Admit Date - 12/31/2016  Outpatient Primary MD for the patient is Patient, No Pcp Per  Referring MD/NP/PA: Alvira Monday  Outpatient Specialists:   Patient coming from: home  Chief Complaint  Patient presents with  . Hematemesis  . Hypotension      HPI:    Mubarak Bevens  is a 61 y.o. male, w Genella Rife c/o n/v, with bloody emesis x 1 earlier today. Slight epigastric discomfort.  Slight diarrhea x2.   + black stool.  Slight radiation of the epigastric pain to the chest.  Sharp, without radiation.  Pt denies fever, chills, cough , palp, sob.  Pt noltes took 2 aspirin the day before otherwise denies NSAID USE or prior hx of PUD or gi bleeding. Pt notes he drinks 6 beers per day.   In ED,  Hgb 12.2,  Bun/creat 52/1.29, K=3.4 , Glucose 166, ED physician consulted critical care initially due to soft bp and then consulted medicine and GI,  Pt will be admitted for GI bleeding.     Review of systems:    In addition to the HPI above,  No Fever-chills, No Headache, No changes with Vision or hearing, No problems swallowing food or Liquids, No  Cough or Shortness of Breath, No Blood in  Urine, No dysuria, No new skin rashes or bruises, No new joints pains-aches,  No new weakness, tingling, numbness in any extremity, No recent weight gain or loss, No polyuria, polydypsia or polyphagia, No significant Mental Stressors.  A full 10 point Review of Systems was done, except as stated above, all other Review of Systems were negative.   With Past History of the following :    Past Medical History:  Diagnosis Date  . GERD (gastroesophageal reflux disease)       Past Surgical History:  Procedure Laterality Date  . WRIST SURGERY        Social History:       Social History  Substance Use Topics  . Smoking status: Current Every Day Smoker    Packs/day: 1.00    Years: 15.00    Types: Cigarettes  . Smokeless tobacco: Never Used  . Alcohol use Yes     Comment: 3-4 beers per day      Lives - at home  Mobility -  Walks by self   Family History :     Family History  Problem Relation Age of Onset  . CAD Mother       Home Medications:   Prior to Admission medications   Medication Sig Start Date End Date Taking? Authorizing Provider  docusate sodium (COLACE) 50 MG capsule Take 1 capsule (50 mg total) by mouth 2 (two) times daily. 12/30/16  Yes Yu, Amy V, PA-C  polyethylene  glycol (MIRALAX) packet Take 17 g by mouth daily. Patient not taking: Reported on 12/31/2016 12/30/16   Belinda Fisher, PA-C     Allergies:    No Known Allergies   Physical Exam:   Vitals  Blood pressure 93/67, pulse 78, temperature 97.8 F (36.6 C), temperature source Oral, resp. rate 18, height  (1.676 m), weight 67.6 kg (149 lb), SpO2 97 %.   1. General  lying in bed in NAD,    2. Normal affect and insight, Not Suicidal or Homicidal, Awake Alert, Oriented X 3.  3. No F.N deficits, ALL C.Nerves Intact, Strength 5/5 all 4 extremities, Sensation intact all 4 extremities, Plantars down going.  4. Ears and Eyes appear Normal, Conjunctivae clear, PERRLA. Moist Oral Mucosa.  5. Supple Neck, No JVD, No cervical lymphadenopathy appriciated, No Carotid Bruits.  6. Symmetrical Chest wall movement, Good air movement bilaterally, CTAB.  7. RRR, No Gallops, Rubs or Murmurs, No Parasternal Heave.  8. Positive Bowel Sounds, Abdomen Soft, No tenderness, No organomegaly appriciated,No rebound -guarding or rigidity.  9.  No Cyanosis, Normal Skin Turgor, No Skin Rash or Bruise.  10. Good muscle tone,  joints appear normal , no effusions, Normal ROM.  11. No Palpable Lymph Nodes in Neck or Axillae  No palmar erythema, no spider, no caput medusa. No  asterixis  ekg nsr at 80, nl axis, slight j point elevation in v3, no st-t changes c./w ischemia   Data Review:    CBC  Recent Labs Lab 12/31/16 1717 12/31/16 1725  WBC 8.6  --   HGB 12.2* 11.9*  HCT 35.1* 35.0*  PLT 182  --   MCV 90.5  --   MCH 31.4  --   MCHC 34.8  --   RDW 13.0  --    ------------------------------------------------------------------------------------------------------------------  Chemistries   Recent Labs Lab 12/31/16 1717 12/31/16 1725  NA 138 140  K 3.5 3.4*  CL 104 101  CO2 25  --   GLUCOSE 166* 171*  BUN 52* 49*  CREATININE 1.29* 1.20  CALCIUM 8.6*  --   AST 23  --   ALT 18  --   ALKPHOS 65  --   BILITOT 0.9  --    ------------------------------------------------------------------------------------------------------------------ estimated creatinine clearance is 59.8 mL/min (by C-G formula based on SCr of 1.2 mg/dL). ------------------------------------------------------------------------------------------------------------------ No results for input(s): TSH, T4TOTAL, T3FREE, THYROIDAB in the last 72 hours.  Invalid input(s): FREET3  Coagulation profile  Recent Labs Lab 12/31/16 1726  INR 1.09   ------------------------------------------------------------------------------------------------------------------- No results for input(s): DDIMER in the last 72 hours. -------------------------------------------------------------------------------------------------------------------  Cardiac Enzymes No results for input(s): CKMB, TROPONINI, MYOGLOBIN in the last 168 hours.  Invalid input(s): CK ------------------------------------------------------------------------------------------------------------------ No results found for: BNP   ---------------------------------------------------------------------------------------------------------------  Urinalysis    Component Value Date/Time   LABSPEC >=1.030 06/14/2015 1903    PHURINE 5.5 06/14/2015 1903   GLUCOSEU NEGATIVE 06/14/2015 1903   HGBUR NEGATIVE 06/14/2015 1903   BILIRUBINUR NEGATIVE 06/14/2015 1903   KETONESUR NEGATIVE 06/14/2015 1903   PROTEINUR NEGATIVE 06/14/2015 1903   UROBILINOGEN 0.2 06/14/2015 1903   NITRITE NEGATIVE 06/14/2015 1903   LEUKOCYTESUR NEGATIVE 06/14/2015 1903    ----------------------------------------------------------------------------------------------------------------   Imaging Results:    Dg Abd 1 View  Result Date: 12/30/2016 CLINICAL DATA:  Abdominal pain. EXAM: ABDOMEN - 1 VIEW COMPARISON:  No recent prior. FINDINGS: Soft tissue structures are unremarkable. No bowel distention. No free air. No acute bony abnormality. Small calcific density noted adjacent to the left  L4 vertebral body. This may be related to adjacent lumbar spine degenerative disease. Small left ureteral stone cannot be excluded. Diffuse prominent degenerative change lumbar spine. No acute bony abnormality. IMPRESSION: 1.  No bowel distention or free air. 2. Small calcific density noted adjacent to the left portion of L4, possibly related to degenerative disease. Small left ureteral stone cannot be excluded. Electronically Signed   By: Maisie Fus  Register   On: 12/30/2016 11:28      Assessment & Plan:    Principal Problem:   GI bleeding Active Problems:   Anemia   Hypotension   Hyperglycemia   Renal insufficiency     Hypotension Tele tropi q6h x3 Check cortisol Hydrate with ns iv Serial cbc  Chest pain Tele Trop I q6h x3 Check echo  Upper gi bleeding protonix  iv x1 then  /hour NPO  Ns iv Serial cbc Check pt, ptt (due to hx of etoh use) GI consult much appreciated  Anemia Serial cbc  Hypokalemia Replete Check cmp in am  Renal insufficiency Hydrate with ns iv Check cmp in am  Hyperglycemia Check hga1c      DVT Prophylaxis  SCDs   AM Labs Ordered, also please review Full Orders  Family Communication:  Admission, patients condition and plan of care including tests being ordered have been discussed with the patient  who indicate understanding and agree with the plan and Code Status.  Code Status FULL CODE  Likely DC to  home  Condition Critical  Consults called: GI per ED  Admission status: inpatient  Time spent in minutes : 45 minutes, critical care   Pearson Grippe M.D on 12/31/2016 at 7:56 PM  Between 7am to 7pm - Pager - [phone]  . After 7pm go to [email or web address] - password [facility]  [facility] - Office  [phone]

## 2016-12-31 NOTE — ED Notes (Signed)
Admitting MD at the bedside.  

## 2016-12-31 NOTE — ED Provider Notes (Signed)
[facility]   CSN: [id number] Arrival date & time: 12/31/16  1650     History   Chief Complaint Chief Complaint  Patient presents with  . Hematemesis  . Hypotension    HPI Justin Ballard is a 61 y.o. male.  HPI   61 year old male with a history of H. pylori gastritis diagnosed clinically by labs and history who presents with concern for hematemesis, syncope and melena.  Yesterday presented to urgent care with epigastric pain. After his evaluation there, developed black stool x2.  Had 2 more episodes of black stool today and one large episode of hematemesis. Reports significant amount of blood. Had syncopal episode and was found by his sister. Reports drinking alcohol occasionally but not every day. Has not seen gastroenterologist. Denies taking NSAIDs but he is not sure what they are.  Denies pain in other locations on initial eval. Pain is 8/10 in the epigastric area. Reports he no longer has lightheadedness.    Interpreter used  Past Medical History:  Diagnosis Date  . GERD (gastroesophageal reflux disease)     Patient Active Problem List   Diagnosis Date Noted  . GI bleeding 12/31/2016  . Anemia 12/31/2016  . Hypotension 12/31/2016  . Hyperglycemia 12/31/2016  . Renal insufficiency 12/31/2016    Past Surgical History:  Procedure Laterality Date  . WRIST SURGERY         Home Medications    Prior to Admission medications   Medication Sig Start Date End Date Taking? Authorizing Provider  docusate sodium (COLACE) 50 MG capsule Take 1 capsule (50 mg total) by mouth 2 (two) times daily. 12/30/16  Yes Yu, Amy V, PA-C  polyethylene glycol (MIRALAX) packet Take 17 g by mouth daily. Patient not taking: Reported on 12/31/2016 12/30/16   Lurline Idol    Family History Family History  Problem Relation Age of Onset  . CAD Mother     Social History Social History  Substance Use Topics  . Smoking status: Current Every Day Smoker   Packs/day: 1.00    Years: 15.00    Types: Cigarettes  . Smokeless tobacco: Never Used  . Alcohol use Yes     Comment: 3-4 beers per day      Allergies   Patient has no known allergies.   Review of Systems Review of Systems  Constitutional: Positive for fatigue. Negative for fever.  HENT: Negative for sore throat.   Eyes: Negative for visual disturbance.  Respiratory: Negative for shortness of breath.   Cardiovascular: Negative for chest pain.  Gastrointestinal: Positive for abdominal pain, blood in stool, diarrhea, nausea and vomiting.  Genitourinary: Negative for difficulty urinating.  Musculoskeletal: Negative for back pain and neck stiffness.  Skin: Negative for rash.  Neurological: Positive for syncope and light-headedness. Negative for headaches.     Physical Exam Updated Vital Signs BP (!) 84/66 (BP Location: Left Arm)   Pulse 73   Temp 98.6 F (37 C) (Oral)   Resp 14   Ht  (1.676 m)   Wt 67.6 kg (149 lb)   SpO2 94%   BMI 24.05 kg/m   Physical Exam  Constitutional: He is oriented to person, place, and time. He appears well-developed and well-nourished. No distress.  HENT:  Head: Normocephalic and atraumatic.  Eyes: Conjunctivae and EOM are normal.  Neck: Normal range of motion.  Cardiovascular: Normal rate, regular rhythm, normal heart sounds and intact distal pulses.  Exam reveals no gallop and no friction rub.  No murmur heard. Pulmonary/Chest: Effort normal and breath sounds normal. No respiratory distress. He has no wheezes. He has no rales.  Abdominal: Soft. He exhibits no distension. There is tenderness (mild LUQ). There is no guarding.  Musculoskeletal: He exhibits no edema.  Neurological: He is alert and oriented to person, place, and time.  Skin: Skin is warm and dry. He is not diaphoretic. There is pallor.  Nursing note and vitals reviewed.    ED Treatments / Results  Labs (all labs ordered are listed, but only abnormal results are  displayed) Labs Reviewed  COMPREHENSIVE METABOLIC PANEL - Abnormal; Notable for the following:       Result Value   Glucose, Bld 166 (*)    BUN 52 (*)    Creatinine, Ser 1.29 (*)    Calcium 8.6 (*)    Total Protein 6.3 (*)    Albumin 3.3 (*)    GFR calc non Af Amer 59 (*)    All other components within normal limits  CBC - Abnormal; Notable for the following:    RBC 3.88 (*)    Hemoglobin 12.2 (*)    HCT 35.1 (*)    All other components within normal limits  POC OCCULT BLOOD, ED - Abnormal; Notable for the following:    Fecal Occult Bld POSITIVE (*)    All other components within normal limits  I-STAT CHEM 8, ED - Abnormal; Notable for the following:    Potassium 3.4 (*)    BUN 49 (*)    Glucose, Bld 171 (*)    Calcium, Ion 1.11 (*)    Hemoglobin 11.9 (*)    HCT 35.0 (*)    All other components within normal limits  MRSA PCR SCREENING  PROTIME-INR  LIPASE, BLOOD  CORTISOL  HIV ANTIBODY (ROUTINE TESTING)  COMPREHENSIVE METABOLIC PANEL  CBC  PROTIME-INR  APTT  TROPONIN I  TROPONIN I  TROPONIN I  CBC  CBC  HEMOGLOBIN A1C  CBC  CBC  I-STAT CG4 LACTIC ACID, ED  I-STAT CG4 LACTIC ACID, ED  TYPE AND SCREEN  ABO/RH  PREPARE RBC (CROSSMATCH)    EKG  EKG Interpretation  Date/Time:  Wednesday December 31 2016 17:02:33 EDT Ventricular Rate:  82 PR Interval:    QRS Duration: 101 QT Interval:  392 QTC Calculation: 458 R Axis:   59 Text Interpretation:  Sinus rhythm Minimal ST depression, inferior leads Borderline ST elevation, anterior leads No previous ECGs available Confirmed by Alvira Monday ([id number]) on 12/31/2016 7:03:18 PM       Radiology Dg Abd 1 View  Result Date: 12/30/2016 CLINICAL DATA:  Abdominal pain. EXAM: ABDOMEN - 1 VIEW COMPARISON:  No recent prior. FINDINGS: Soft tissue structures are unremarkable. No bowel distention. No free air. No acute bony abnormality. Small calcific density noted adjacent to the left L4 vertebral body. This may be  related to adjacent lumbar spine degenerative disease. Small left ureteral stone cannot be excluded. Diffuse prominent degenerative change lumbar spine. No acute bony abnormality. IMPRESSION: 1.  No bowel distention or free air. 2. Small calcific density noted adjacent to the left portion of L4, possibly related to degenerative disease. Small left ureteral stone cannot be excluded. Electronically Signed   By: Maisie Fus  Register   On: 12/30/2016 11:28    Procedures Procedures (including critical care time)  Medications Ordered in ED Medications  pantoprazole (PROTONIX) 80 mg in sodium chloride 0.9 % 250 mL (0.32 mg/mL) infusion (8 mg/hr Intravenous New Bag/Given 12/31/16 2100)  0.9 %  NaCl with KCl 20 mEq/ L  infusion (not administered)  acetaminophen (TYLENOL) tablet 650 mg (not administered)    Or  acetaminophen (TYLENOL) suppository 650 mg (not administered)  pantoprazole (PROTONIX) 80 mg in sodium chloride 0.9 % 100 mL IVPB (0 mg Intravenous Stopped 12/31/16 2101)  sodium chloride 0.9 % bolus 1,000 mL (0 mLs Intravenous Stopped 12/31/16 1833)  0.9 %  sodium chloride infusion (10 mL/hr Intravenous New Bag/Given 12/31/16 2059)   CRITICAL CARE: GI bleed, systolic blood pressure 80s, GI consult, fluid boluses Performed by: Lynnea Ferrier   Total critical care time: 30 minutes  Critical care time was exclusive of separately billable procedures and treating other patients.  Critical care was necessary to treat or prevent imminent or life-threatening deterioration.  Critical care was time spent personally by me on the following activities: development of treatment plan with patient and/or surrogate as well as nursing, discussions with consultants, evaluation of patient's response to treatment, examination of patient, obtaining history from patient or surrogate, ordering and performing treatments and interventions, ordering and review of laboratory studies, ordering and review of radiographic  studies, pulse oximetry and re-evaluation of patient's condition.   Initial Impression / Assessment and Plan / ED Course  I have reviewed the triage vital signs and the nursing notes.  Pertinent labs & imaging results that were available during my care of the patient were reviewed by me and considered in my medical decision making (see chart for details).     61 year old male with a history of H. pylori gastritis diagnosed clinically by labs and history who presents with concern for hematemesis, syncope and melena.  Systolic blood pressure 82 on arrival, with normal mental status, no tachycardia.  2 18-gauge IVs were established in addition to the 20-gauge IV that was placed by EMS.  Initiated labs including an i-STAT Chem-8 which showed a hemoglobin 11.9.  CBC showed hemoglobin of 12.2, with prior values a year ago noted to be around 15. Platelets and INR within normal limits.  Lactic acid WNL.  History of h pylori, melena on exam, elevated BUN suspicious for peptic ulcer disease and upper GI source of bleeding.  Placed on protonix gtt.  Denies significant ETOH use or cirrhosis on my history and doubt variceal bleeding.  Abdominal exam with mild LUQ tenderness, suspect epigastric discomfort secondary to PUD.   Given protonix, IV saline x2L  Initially ordered blood transfusion given concern for continuing hypotension, however this was not administrated as his blood pressures stabilized with normal MAPs, systolic in 90s, normal HR and mental status and patient without dizziness.  Discussed with nursing to administer blood if blood pressures drop again to 80s.   Discussed with Dr. Ewing Schlein who will evaluate the patient in the morning unless he develops bleeding or change in status overnight. He currently has had no further episodes of hematemesis or melena.   Discussed with Dr. Marchelle Gearing of critical care. Feel at this time he is stable for stepdown, however critical care aware of him if he develops  change in status.    Patient NPO, admitted to Dr. Selena Batten, hospitalist for further care.  Final Clinical Impressions(s) / ED Diagnoses   Final diagnoses:  Upper GI bleed  Hypotension, unspecified hypotension type  Melena    New Prescriptions Current Discharge Medication List       Alvira Monday, MD 01/01/17 [phone]

## 2016-12-31 NOTE — ED Notes (Signed)
ED Provider at bedside. 

## 2016-12-31 NOTE — ED Notes (Signed)
Order to hold on blood transfusion now per ED provider since SBP is on the 90's, order to monitor for dropping in SBP.

## 2016-12-31 NOTE — ED Notes (Signed)
2 units ready 

## 2016-12-31 NOTE — ED Notes (Signed)
Report attempted, RN to call back. 

## 2017-01-01 ENCOUNTER — Inpatient Hospital Stay (HOSPITAL_COMMUNITY): Payer: Self-pay

## 2017-01-01 ENCOUNTER — Other Ambulatory Visit (HOSPITAL_COMMUNITY): Payer: Self-pay

## 2017-01-01 ENCOUNTER — Encounter (HOSPITAL_COMMUNITY)
Admission: EM | Disposition: A | Discharge status: Home / Self Care | Payer: Self-pay | Source: Home / Self Care | Attending: Family Medicine

## 2017-01-01 ENCOUNTER — Inpatient Hospital Stay (HOSPITAL_COMMUNITY): Payer: Self-pay | Admitting: Anesthesiology

## 2017-01-01 ENCOUNTER — Encounter (HOSPITAL_COMMUNITY): Payer: Self-pay

## 2017-01-01 DIAGNOSIS — K920 Hematemesis: Secondary | ICD-10-CM

## 2017-01-01 DIAGNOSIS — R079 Chest pain, unspecified: Secondary | ICD-10-CM

## 2017-01-01 HISTORY — PX: ESOPHAGOGASTRODUODENOSCOPY: SHX5428

## 2017-01-01 LAB — COMPREHENSIVE METABOLIC PANEL
ALT: 13 U/L — AB (ref 17–63)
AST: 18 U/L (ref 15–41)
Albumin: 2.9 g/dL — ABNORMAL LOW (ref 3.5–5.0)
Alkaline Phosphatase: 50 U/L (ref 38–126)
Anion gap: 8 (ref 5–15)
BUN: 21 mg/dL — AB (ref 6–20)
CHLORIDE: 111 mmol/L (ref 101–111)
CO2: 20 mmol/L — AB (ref 22–32)
CREATININE: 0.76 mg/dL (ref 0.61–1.24)
Calcium: 7.8 mg/dL — ABNORMAL LOW (ref 8.9–10.3)
GFR calc Af Amer: >60 mL/min (ref >60)
GFR calc non Af Amer: >60 mL/min (ref >60)
GLUCOSE: 80 mg/dL (ref 65–99)
POTASSIUM: 3.8 mmol/L (ref 3.5–5.1)
SODIUM: 139 mmol/L (ref 135–145)
Total Bilirubin: 0.5 mg/dL (ref 0.3–1.2)
Total Protein: 5.3 g/dL — ABNORMAL LOW (ref 6.5–8.1)

## 2017-01-01 LAB — CBC
HCT: 25 % — ABNORMAL LOW (ref 39.0–52.0)
HCT: 27.1 % — ABNORMAL LOW (ref 39.0–52.0)
HEMATOCRIT: 25.4 % — AB (ref 39.0–52.0)
HEMOGLOBIN: 8.6 g/dL — AB (ref 13.0–17.0)
HEMOGLOBIN: 9.3 g/dL — AB (ref 13.0–17.0)
Hemoglobin: 9 g/dL — ABNORMAL LOW (ref 13.0–17.0)
MCH: 31.3 pg (ref 26.0–34.0)
MCH: 31.5 pg (ref 26.0–34.0)
MCH: 32.1 pg (ref 26.0–34.0)
MCHC: 34.3 g/dL (ref 30.0–36.0)
MCHC: 34.4 g/dL (ref 30.0–36.0)
MCHC: 35.4 g/dL (ref 30.0–36.0)
MCV: 90.7 fL (ref 78.0–100.0)
MCV: 91.2 fL (ref 78.0–100.0)
MCV: 91.6 fL (ref 78.0–100.0)
PLATELETS: 131 10*3/uL — AB (ref 150–400)
PLATELETS: 136 10*3/uL — AB (ref 150–400)
PLATELETS: 145 10*3/uL — AB (ref 150–400)
RBC: 2.73 MIL/uL — AB (ref 4.22–5.81)
RBC: 2.8 MIL/uL — ABNORMAL LOW (ref 4.22–5.81)
RBC: 2.97 MIL/uL — ABNORMAL LOW (ref 4.22–5.81)
RDW: 13.3 % (ref 11.5–15.5)
RDW: 13.3 % (ref 11.5–15.5)
RDW: 13.3 % (ref 11.5–15.5)
WBC: 5.6 10*3/uL (ref 4.0–10.5)
WBC: 7.6 10*3/uL (ref 4.0–10.5)
WBC: 7.9 10*3/uL (ref 4.0–10.5)

## 2017-01-01 LAB — POCT I-STAT, CHEM 8
BUN: 24 mg/dL — AB (ref 6–20)
CHLORIDE: 109 mmol/L (ref 101–111)
Calcium, Ion: 1.16 mmol/L (ref 1.15–1.40)
Creatinine, Ser: 0.7 mg/dL (ref 0.61–1.24)
Glucose, Bld: 83 mg/dL (ref 65–99)
HCT: 26 % — ABNORMAL LOW (ref 39.0–52.0)
Hemoglobin: 8.8 g/dL — ABNORMAL LOW (ref 13.0–17.0)
Potassium: 3.9 mmol/L (ref 3.5–5.1)
SODIUM: 143 mmol/L (ref 135–145)
TCO2: 23 mmol/L (ref 22–32)

## 2017-01-01 LAB — DIFFERENTIAL
BASOS ABS: 0 10*3/uL (ref 0.0–0.1)
Basophils Relative: 0 %
EOS ABS: 0 10*3/uL (ref 0.0–0.7)
EOS PCT: 1 %
Lymphocytes Relative: 22 %
Lymphs Abs: 1.7 10*3/uL (ref 0.7–4.0)
MONO ABS: 0.5 10*3/uL (ref 0.1–1.0)
MONOS PCT: 7 %
Neutro Abs: 5.6 10*3/uL (ref 1.7–7.7)
Neutrophils Relative %: 70 %

## 2017-01-01 LAB — HEMOGLOBIN A1C
HEMOGLOBIN A1C: 5.2 % (ref 4.8–5.6)
Mean Plasma Glucose: 102.54 mg/dL

## 2017-01-01 LAB — HIV ANTIBODY (ROUTINE TESTING W REFLEX): HIV SCREEN 4TH GENERATION: NONREACTIVE

## 2017-01-01 LAB — MRSA PCR SCREENING: MRSA BY PCR: NEGATIVE

## 2017-01-01 LAB — TROPONIN I
Troponin I: <0.03 ng/mL (ref <0.03)
Troponin I: <0.03 ng/mL (ref <0.03)

## 2017-01-01 LAB — GLUCOSE, CAPILLARY
Glucose-Capillary: 118 mg/dL — ABNORMAL HIGH (ref 65–99)
Glucose-Capillary: 80 mg/dL (ref 65–99)

## 2017-01-01 LAB — APTT: APTT: 33 s (ref 24–36)

## 2017-01-01 LAB — CORTISOL: Cortisol, Plasma: 3.6 ug/dL

## 2017-01-01 LAB — PROTIME-INR
INR: 1.2
Prothrombin Time: 15.1 seconds (ref 11.4–15.2)

## 2017-01-01 SURGERY — EGD (ESOPHAGOGASTRODUODENOSCOPY)
Anesthesia: General

## 2017-01-01 MED ORDER — PHENYLEPHRINE HCL 10 MG/ML IJ SOLN
INTRAMUSCULAR | Status: DC | PRN
  Administered 2017-01-01: 40 ug via INTRAVENOUS

## 2017-01-01 MED ORDER — ALBUMIN HUMAN 5 % IV SOLN
INTRAVENOUS | Status: DC | PRN
  Administered 2017-01-01: 09:00:00 via INTRAVENOUS

## 2017-01-01 MED ORDER — ROCURONIUM BROMIDE 10 MG/ML (PF) SYRINGE
PREFILLED_SYRINGE | INTRAVENOUS | Status: DC | PRN
  Administered 2017-01-01: 40 mg via INTRAVENOUS

## 2017-01-01 MED ORDER — ADULT MULTIVITAMIN W/MINERALS CH
1.0000 | ORAL_TABLET | Freq: Every day | ORAL | Status: DC
  Administered 2017-01-02 – 2017-01-03 (×2): 1 via ORAL
  Filled 2017-01-01 (×2): qty 1

## 2017-01-01 MED ORDER — LORAZEPAM 2 MG/ML IJ SOLN: 1.0000 mg | Freq: Four times a day (QID) | INTRAMUSCULAR | Status: DC | PRN

## 2017-01-01 MED ORDER — SUGAMMADEX SODIUM 200 MG/2ML IV SOLN
INTRAVENOUS | Status: DC | PRN
  Administered 2017-01-01: 200 mg via INTRAVENOUS

## 2017-01-01 MED ORDER — ONDANSETRON HCL 4 MG/2ML IJ SOLN
INTRAMUSCULAR | Status: DC | PRN
  Administered 2017-01-01: 4 mg via INTRAVENOUS

## 2017-01-01 MED ORDER — PROPOFOL 10 MG/ML IV BOLUS
INTRAVENOUS | Status: DC | PRN
  Administered 2017-01-01: 120 mg via INTRAVENOUS

## 2017-01-01 MED ORDER — SUCRALFATE 1 G PO TABS
1.0000 g | ORAL_TABLET | Freq: Four times a day (QID) | ORAL | Status: DC
  Administered 2017-01-01 – 2017-01-03 (×9): 1 g via ORAL
  Filled 2017-01-01 (×11): qty 1

## 2017-01-01 MED ORDER — FOLIC ACID 1 MG PO TABS
1.0000 mg | ORAL_TABLET | Freq: Every day | ORAL | Status: DC
  Administered 2017-01-02 – 2017-01-03 (×2): 1 mg via ORAL
  Filled 2017-01-01 (×2): qty 1

## 2017-01-01 MED ORDER — VITAMIN B-1 100 MG PO TABS
100.0000 mg | ORAL_TABLET | Freq: Every day | ORAL | Status: DC
  Administered 2017-01-03: 100 mg via ORAL
  Filled 2017-01-01 (×2): qty 1

## 2017-01-01 MED ORDER — THIAMINE HCL 100 MG/ML IJ SOLN
100.0000 mg | Freq: Every day | INTRAMUSCULAR | Status: DC
  Administered 2017-01-02: 100 mg via INTRAVENOUS
  Filled 2017-01-01: qty 2

## 2017-01-01 MED ORDER — LORAZEPAM 1 MG PO TABS: 1.0000 mg | ORAL_TABLET | Freq: Four times a day (QID) | ORAL | Status: DC | PRN

## 2017-01-01 MED ORDER — LIDOCAINE 2% (20 MG/ML) 5 ML SYRINGE
INTRAMUSCULAR | Status: DC | PRN
  Administered 2017-01-01: 40 mg via INTRAVENOUS

## 2017-01-01 MED ORDER — LACTATED RINGERS IV SOLN
INTRAVENOUS | Status: DC
  Administered 2017-01-01: 08:00:00 via INTRAVENOUS

## 2017-01-01 MED ORDER — SUCCINYLCHOLINE CHLORIDE 200 MG/10ML IV SOSY
PREFILLED_SYRINGE | INTRAVENOUS | Status: DC | PRN
  Administered 2017-01-01: 100 mg via INTRAVENOUS

## 2017-01-01 NOTE — Progress Notes (Signed)
Report called to ICU RN.  Patient care completed.  SBP remains in the 80s.  This is baseline for the patient since admission.

## 2017-01-01 NOTE — Anesthesia Procedure Notes (Signed)
Procedure Name: Intubation Date/Time: 01/01/2017 9:23 AM Performed by: Samara Deist Pre-anesthesia Checklist: Patient identified, Emergency Drugs available, Suction available, Patient being monitored and Timeout performed Patient Re-evaluated:Patient Re-evaluated prior to induction Oxygen Delivery Method: Circle system utilized Preoxygenation: Pre-oxygenation with 100% oxygen Induction Type: IV induction and Rapid sequence Grade View: Grade I Tube type: Oral Tube size: 7.5 mm Number of attempts: 1 Airway Equipment and Method: Stylet Placement Confirmation: ETT inserted through vocal cords under direct vision,  positive ETCO2 and breath sounds checked- equal and bilateral Secured at: 21 cm Tube secured with: Tape Dental Injury: Teeth and Oropharynx as per pre-operative assessment

## 2017-01-01 NOTE — Op Note (Addendum)
[facility] Patient Name: Justin Ballard Procedure Date : 01/01/2017 MRN: [id number] Attending MD: Bernette Redbird , MD Date of Birth: 1955/05/03 CSN: [id number] Age: 61 Admit Type: Inpatient Procedure:                Upper GI endoscopy Indications:              Hematemesis. No prior h/o GIB, minimal ASA exposure. Providers:                Bernette Redbird, MD, Janae Sauce. Steele Berg, RN, Arlee Muslim Tech., Technician, Irean Hong, CRNA Referring MD:              Medicines:                Monitored Anesthesia Care Complications:            No immediate complications. Estimated Blood Loss:     Estimated blood loss was minimal. Procedure:                Pre-Anesthesia Assessment:                           - Prior to the procedure, a History and Physical                            was performed, and patient medications and                            allergies were reviewed. The patient's tolerance of                            previous anesthesia was also reviewed. The risks                            and benefits of the procedure and the sedation                            options and risks were discussed with the patient.                            All questions were answered, and informed consent                            was obtained. Prior Anticoagulants: The patient has                            taken aspirin, last dose was 2 days prior to                            procedure. ASA Grade Assessment: III - A patient                            with severe systemic disease. After reviewing the  risks and benefits, the patient was deemed in                            satisfactory condition to undergo the procedure.                           After obtaining informed consent, the endoscope was                            passed under direct vision. Throughout the                            procedure, the patient's  blood pressure, pulse, and                            oxygen saturations were monitored continuously. The                            EG-[id number] ([id number]) scope was introduced through the                            mouth, and advanced to the second part of duodenum.                            The upper GI endoscopy was accomplished without                            difficulty. The patient tolerated the procedure                            well. Scope In: Scope Out: Findings:      The main finding is a large duodenal ulcer (see below).      The examined esophagus was normal.      There is no endoscopic evidence of varices or Mallory-Weiss tear in the       entire esophagus.      Bilious fluid was found on the greater curvature of the stomach.      There is no endoscopic evidence of bleeding or blood in the entire       examined stomach.      The entire examined stomach was normal. Biopsies were taken with a cold       forceps for histology to check for H. pylori at the end of the       procedure. Estimated blood loss was minimal.      The cardia and gastric fundus were normal on retroflexion.      One non-obstructing non-bleeding cratered duodenal ulcer with pigmented       material (red color sign) was found in the proximal duodenal bulb,       involving also the pyloric channel. The lesion was 25 mm in largest       dimension. No visibile vessel or adherent clot; no intervention       performed. Impression:               - No active bleeding or blood in the stomach at the  time of this exam.                           - Normal esophagus.                           - Bilious gastric fluid.                           - Normal stomach. Biopsied.                           - Large duodenal ulcer with pigmented material                            consistent with recent hemorrhage, which accounts                            for the patient's recent bleeding. It appears  to be                            at low risk of re-bleeding. Moderate Sedation:      This patient was sedated with monitored anesthesia care, not moderate       sedation. Recommendation:           - Await pathology results. If H. pylori is present,                            I would definitely treat it.                           - Continue present medications (Protonix infusion).                           - Add sucralfate tablets 1 gram PO QID daily while                            patient is in-house, to help "patch" the ulcer.                           - Clear liquid diet today. Procedure Code(s):        --- Professional ---                           [phone], Esophagogastroduodenoscopy, flexible,                            transoral; with biopsy, single or multiple Diagnosis Code(s):        --- Professional ---                           K26.9, Duodenal ulcer, unspecified as acute or                            chronic, without hemorrhage or perforation  K92.0, Hematemesis CPT copyright 2016 American Medical Association. All rights reserved. The codes documented in this report are preliminary and upon coder review may  be revised to meet current compliance requirements. Bernette Redbird, MD 01/01/2017 9:59:20 AM This report has been signed electronically. Number of Addenda: 0

## 2017-01-01 NOTE — Progress Notes (Signed)
Patient's EGD shows on very large, but not currently bleeding, duodenal ulcer. Please see report for details.  I would continue his Protonix infusion. I have also added sucralfate to his regimen as adjunctive therapy.  I have ordered a clear liquid diet.  The ulcer appears to be at fairly low risk for rebleeding, but the patient certainly needs to stay in the hospital at least the rest of today. I do think that the frequency of blood drawing could be decreased, perhaps to every 12 hours.  Please call me if you have any questions concerning his management.  Florencia Reasons, M.D. Pager [phone] If no answer or after 5 PM call [phone]

## 2017-01-01 NOTE — Progress Notes (Signed)
  Echocardiogram 2D Echocardiogram has been performed.  Nolon Rod 01/01/2017, 2:25 PM

## 2017-01-01 NOTE — Progress Notes (Signed)
Interpreter Wyvonnia Dusky for endoscopy

## 2017-01-01 NOTE — Consult Note (Signed)
Referring Provider:  Dr. Eliezer Bottom Primary Care Physician:  Patient, No Pcp Per Primary Gastroenterologist:  None (unassigned)  Reason for Consultation:  GI bleeding, hematemesis  HPI: Justin Ballard is a 61 y.o. male admitted through the emergency room last night following hematemesis and syncope at home.  There is no prior history of GI bleeding, nor any history of ulcer disease. He had limited aspirin exposure prior to admission. He drinks moderate ethanol.  He has been having epigastric discomfort for several days.  Since admission, his hemoglobin has dropped 3 g, from 12 to a level of 9.3 late last night, and his BUN has remained elevated around 50.  Platelet count is marginally reduced at 145,000, INR normal. He has then grossly hemodynamically stable since admission.   Past Medical History:  Diagnosis Date  . GERD (gastroesophageal reflux disease)     Past Surgical History:  Procedure Laterality Date  . WRIST SURGERY      Prior to Admission medications   Medication Sig Start Date End Date Taking? Authorizing Provider  docusate sodium (COLACE) 50 MG capsule Take 1 capsule (50 mg total) by mouth 2 (two) times daily. 12/30/16  Yes Yu, Amy V, PA-C  polyethylene glycol (MIRALAX) packet Take 17 g by mouth daily. Patient not taking: Reported on 12/31/2016 12/30/16   Belinda Fisher, PA-C    Current Facility-Administered Medications  Medication Dose Route Frequency Provider Last Rate Last Dose  . 0.9 % NaCl with KCl 20 mEq/ L  infusion   Intravenous Continuous Pearson Grippe, MD 100 mL/hr at 01/01/17 0051    . acetaminophen (TYLENOL) tablet 650 mg  650 mg Oral Q6H PRN Pearson Grippe, MD       Or  . acetaminophen (TYLENOL) suppository 650 mg  650 mg Rectal Q6H PRN Pearson Grippe, MD      . folic acid (FOLVITE) tablet 1 mg  1 mg Oral Daily Haydee Salter, MD      . lactated ringers infusion   Intravenous Continuous Bernette Redbird, MD 10 mL/hr at 01/01/17 [phone]    . LORazepam (ATIVAN)  tablet 1 mg  1 mg Oral Q6H PRN Haydee Salter, MD       Or  . LORazepam (ATIVAN) injection 1 mg  1 mg Intravenous Q6H PRN Haydee Salter, MD      . multivitamin with minerals tablet 1 tablet  1 tablet Oral Daily Haydee Salter, MD      . pantoprazole (PROTONIX) 80 mg in sodium chloride 0.9 % 250 mL (0.32 mg/mL) infusion  8 mg/hr Intravenous Continuous Alvira Monday, MD 25 mL/hr at 01/01/17 0625 8 mg/hr at 01/01/17 0625  . thiamine (VITAMIN B-1) tablet 100 mg  100 mg Oral Daily Haydee Salter, MD       Or  . thiamine (B-1) injection 100 mg  100 mg Intravenous Daily Haydee Salter, MD        Allergies as of 12/31/2016  . (No Known Allergies)    Family History  Problem Relation Age of Onset  . CAD Mother     Social History   Social History  . Marital status: Single    Spouse name: N/A  . Number of children: N/A  . Years of education: N/A   Occupational History  . Not on file.   Social History Main Topics  . Smoking status: Current Every Day Smoker    Packs/day: 1.00    Years: 15.00    Types: Cigarettes  .  Smokeless tobacco: Never Used  . Alcohol use Yes     Comment: 3-4 beers per day   . Drug use: No  . Sexual activity: Not on file   Other Topics Concern  . Not on file   Social History Narrative  . No narrative on file    Review of Systems: no pulmonary symptoms, although he does smoke.  Physical Exam: Vital signs in last 24 hours: Temp:  [97.8 F (36.6 C)-99.2 F (37.3 C)] 98.1 F (36.7 C) (09/27 0813) Pulse Rate:  [62-80] 68 (09/27 0813) Resp:  [0-22] 18 (09/27 0813) BP: (82-102)/(55-91) 95/58 (09/27 0813) SpO2:  [93 %-100 %] 100 % (09/27 0813) Weight:  [145 lb (65.8 kg)-149 lb (67.6 kg)] 145 lb (65.8 kg) (09/27 0816) Last BM Date: 12/31/16 This is a healthy-appearing Hispanic male lying on the stretcher in absolutely no distress, fully alert and coherent. Heart rate is low considering his anemia, around 70, radial pulses full, skin is warm  and well-perfused. Conjunctivae are pale, oropharynx benign, no scleral icterus. Chest is clear, heart is without murmur or arrhythmia, abdomen has quiet bowel sounds, no distention, no hepatosplenomegaly, no guarding, mass effect, or tenderness. No peripheral edema.  Intake/Output from previous day: 09/26 0701 - 09/27 0700 In: 3240 [I.V.:1140; IV Piggyback:2100] Out: -  Intake/Output this shift: No intake/output data recorded.  Lab Results:  Recent Labs  12/31/16 1717 12/31/16 1725 12/31/16 2341  WBC 8.6  --  5.6  HGB 12.2* 11.9* 9.3*  HCT 35.1* 35.0* 27.1*  PLT 182  --  145*   BMET  Recent Labs  12/31/16 1717 12/31/16 1725  NA 138 140  K 3.5 3.4*  CL 104 101  CO2 25  --   GLUCOSE 166* 171*  BUN 52* 49*  CREATININE 1.29* 1.20  CALCIUM 8.6*  --    LFT  Recent Labs  12/31/16 1717  PROT 6.3*  ALBUMIN 3.3*  AST 23  ALT 18  ALKPHOS 65  BILITOT 0.9   PT/INR  Recent Labs  12/31/16 1726 12/31/16 2341  LABPROT 14.0 15.1  INR 1.09 1.20    Studies/Results: X-ray Chest Pa And Lateral  Result Date: 01/01/2017 CLINICAL DATA:  Chest pain. EXAM: CHEST  2 VIEW COMPARISON:  Radiographs 02/22/2009 FINDINGS: Low lung volumes. The cardiomediastinal contours are normal. Pulmonary vasculature is normal. No consolidation, pleural effusion, or pneumothorax. No acute osseous abnormalities are seen. IMPRESSION: Low lung volumes without acute abnormality. Electronically Signed   By: Rubye Oaks M.D.   On: 01/01/2017 00:34   Dg Abd 1 View  Result Date: 12/30/2016 CLINICAL DATA:  Abdominal pain. EXAM: ABDOMEN - 1 VIEW COMPARISON:  No recent prior. FINDINGS: Soft tissue structures are unremarkable. No bowel distention. No free air. No acute bony abnormality. Small calcific density noted adjacent to the left L4 vertebral body. This may be related to adjacent lumbar spine degenerative disease. Small left ureteral stone cannot be excluded. Diffuse prominent degenerative change  lumbar spine. No acute bony abnormality. IMPRESSION: 1.  No bowel distention or free air. 2. Small calcific density noted adjacent to the left portion of L4, possibly related to degenerative disease. Small left ureteral stone cannot be excluded. Electronically Signed   By: Maisie Fus  Register   On: 12/30/2016 11:28    Impression: 1. Acute upper GI bleed characterized by hematemesis and syncope, now stable clinically 2. Posthemorrhagic anemia, moderate 3. Prodrome of mild epigastric discomfort  Discussion: The overall picture would seem most compatible with peptic ulcer disease.  Doubt liver disease and that there are no peripheral stigmata of it, nor a history of excessive ethanol consumption, nor are there labs to suggest that there is liver disease.  Plan: Proceed to endoscopic evaluation. The nature, purpose, and risks of the procedure were discussed with the patient via a telephone interpreter and he had no questions and is agreeable to proceed. Further management to depend on the endoscopic findings.  Agree with IV PPI infusion in the meantime.   LOS: 1 day   Tatyanna Cronk V  01/01/2017, 9:06 AM   Pager [phone] If no answer or after 5 PM call [phone]

## 2017-01-01 NOTE — Anesthesia Preprocedure Evaluation (Addendum)
Anesthesia Evaluation  Patient identified by MRN, date of birth, ID band Patient awake    Reviewed: Allergy & Precautions, NPO status , Patient's Chart, lab work & pertinent test results  History of Anesthesia Complications Negative for: history of anesthetic complications  Airway Mallampati: II  TM Distance: >3 FB Neck ROM: Full    Dental no notable dental hx.    Pulmonary Current Smoker,    breath sounds clear to auscultation       Cardiovascular negative cardio ROS   Rhythm:Regular Rate:Normal     Neuro/Psych    GI/Hepatic GERD  ,GI bleed   Endo/Other  negative endocrine ROS  Renal/GU Renal InsufficiencyRenal disease     Musculoskeletal   Abdominal   Peds  Hematology  (+) anemia ,   Anesthesia Other Findings   Reproductive/Obstetrics                            Anesthesia Physical Anesthesia Plan  ASA: III  Anesthesia Plan: MAC   Post-op Pain Management:    Induction: Intravenous  PONV Risk Score and Plan: 0  Airway Management Planned: Natural Airway  Additional Equipment:   Intra-op Plan:   Post-operative Plan:   Informed Consent: I have reviewed the patients History and Physical, chart, labs and discussed the procedure including the risks, benefits and alternatives for the proposed anesthesia with the patient or authorized representative who has indicated his/her understanding and acceptance.     Plan Discussed with:   Anesthesia Plan Comments:         Anesthesia Quick Evaluation

## 2017-01-01 NOTE — Anesthesia Postprocedure Evaluation (Signed)
Anesthesia Post Note  Patient: Engineer, manufacturing  Procedure(s) Performed: Procedure(s) (LRB): ESOPHAGOGASTRODUODENOSCOPY (EGD) (N/A)     Anesthesia Post Evaluation  Last Vitals:  Vitals:   01/01/17 0950 01/01/17 1112  BP: (!) 82/55 91/63  Pulse: 76 72  Resp: 20   Temp: 36.6 C 36.6 C  SpO2: 98%     Last Pain:  Vitals:   01/01/17 1113  TempSrc:   PainSc: 0-No pain                 Thelma Viana,JAMES TERRILL

## 2017-01-01 NOTE — Care Management Note (Addendum)
Case Management Note  Patient Details  Name: Justin Ballard MRN: [id number] Date of Birth: August 01, 1955  Subjective/Objective:   From home with sister , pta indep, GIB,  patient has no insurance , no PCP, NCM scheduled [facility] at [facility] am, brochure given to patient and [facility] brochure given to patient for med ast, if he is dc during the week.  If patient dc on weekend will need ast with Match Letter from NCM.  Spanish interpreter scheduled.  9/29 1018 Justin Cape RN, BSN - NCM gave patient a Match Letter to asst with his medications, he will be going to [place] per patient.                 Action/Plan: NCM will follow for dc needs.   Expected Discharge Date:                  Expected Discharge Plan:  Home/Self Care  In-House Referral:     Discharge planning Services  CM Consult, Medication Assistance, Follow-up appt scheduled, Indigent Health Clinic, Match Letter  Post Acute Care Choice:    Choice offered to:     DME Arranged:    DME Agency:     HH Arranged:    HH Agency:     Status of Service:  Completed, signed off  If discussed at [employer] of Stay Meetings, dates discussed:    Additional Comments:  Justin Haven, RN 01/01/2017, 5:09 PM

## 2017-01-01 NOTE — Progress Notes (Signed)
Responded to [facility] Consult to assist patient with AD . Per nurse patient gone for procedure and want return until afternoon.  AD form was left with patient nurse along with name and number of spanish interpreter.  Nurse will page Chaplain when patient is ready.  Justin Ballard, [place], [facility], Pager [phone]

## 2017-01-01 NOTE — Transfer of Care (Signed)
Immediate Anesthesia Transfer of Care Note  Patient: Justin Ballard  Procedure(s) Performed: Procedure(s): ESOPHAGOGASTRODUODENOSCOPY (EGD) (N/A)  Patient Location: Endoscopy Unit  Anesthesia Type:General  Level of Consciousness: drowsy  Airway & Oxygen Therapy: Patient Spontanous Breathing and Patient connected to nasal cannula oxygen  Post-op Assessment: Report given to RN and Post -op Vital signs reviewed and stable  Post vital signs: Reviewed and stable  Last Vitals:  Vitals:   01/01/17 0813 01/01/17 0950  BP: (!) 95/58 (!) 82/55  Pulse: 68 76  Resp: 18 20  Temp: 36.7 C 36.6 C  SpO2: 100% 98%    Last Pain:  Vitals:   01/01/17 0950  TempSrc: Oral  PainSc:          Complications: No apparent anesthesia complications

## 2017-01-01 NOTE — Progress Notes (Signed)
[facility] PROGRESS NOTE  Justin Ballard ZOX:[id number] DOB: February 10, 1956 DOA: 12/31/2016 PCP: Patient, No Pcp Per  Assessment/Plan:  Hypotension Tele tropi q6h x3 Hydrate with ns iv Serial cbc  Chest pain Tele Trop I q6h x3 Check echo  Upper gi bleeding protonix  iv x1 then  /hour NPO  Ns iv Serial cbc Check pt, ptt (due to hx of etoh use) GI consult much appreciated  Anemia Serial cbc  Hypokalemia Replete Check cmp in am  Renal insufficiency, improved Hydrate with ns iv Check cmp in am  Hyperglycemia Check hga1c      DVT Prophylaxis  SCDs   AM Labs Ordered, also please review Full Orders  Family Communication: None at bedside  Code Status FULL CODE  Likely DC to  home  Condition Critical  Consults called: GI per ED  Admission status: inpatient  Consultants:  GI  Procedures:  EGD  Antibiotics:  none (indicate start date, and stop date if known)  HPI/Subjective: Plan mild epigastric abdominal discomfort. Chest shortness breath nausea or vomiting. Also states that he is tired post procedure.  Objective: Vitals:   01/01/17 1112 01/01/17 1618  BP: 91/63 96/66  Pulse: 72   Resp:    Temp: 97.8 F (36.6 C) 98.5 F (36.9 C)  SpO2:      Intake/Output Summary (Last 24 hours) at 01/01/17 1828 Last data filed at 01/01/17 1800  Gross per 24 hour  Intake             5070 ml  Output                0 ml  Net             5070 ml   Filed Weights   12/31/16 1701 12/31/16 2245 01/01/17 0816  Weight: 67.6 kg (149 lb) 65.8 kg (145 lb 1 oz) 65.8 kg (145 lb)    Exam:  Physical Exam  Constitutional: He is oriented to person, place, and time. She appears well-developed and well-nourished.  Cardiovascular: Normal rate and regular rhythm.   Pulmonary/Chest: Effort normal and breath sounds normal. No respiratory distress.   Abdominal: Soft. Bowel sounds are normal. Diffuse tenderness.  Neurological:  He is alert and oriented to person, place, and time. No cranial nerve deficit.     Data Reviewed: Basic Metabolic Panel:  Recent Labs Lab 12/31/16 1717 12/31/16 1725 01/01/17 0859 01/01/17 1047  NA 138 140 143 139  K 3.5 3.4* 3.9 3.8  CL 104 101 109 111  CO2 25  --   --  20*  GLUCOSE 166* 171* 83 80  BUN 52* 49* 24* 21*  CREATININE 1.29* 1.20 0.70 0.76  CALCIUM 8.6*  --   --  7.8*   Liver Function Tests:  Recent Labs Lab 12/31/16 1717 01/01/17 1047  AST 23 18  ALT 18 13*  ALKPHOS 65 50  BILITOT 0.9 0.5  PROT 6.3* 5.3*  ALBUMIN 3.3* 2.9*    Recent Labs Lab 12/31/16 1726  LIPASE 35   No results for input(s): AMMONIA in the last 168 hours. CBC:  Recent Labs Lab 12/31/16 1717 12/31/16 1725 12/31/16 2341 01/01/17 0859 01/01/17 1047  WBC 8.6  --  5.6  --  7.9  HGB 12.2* 11.9* 9.3* 8.8* 9.0*  HCT 35.1* 35.0* 27.1* 26.0* 25.4*  MCV 90.5  --  91.2  --  90.7  PLT 182  --  145*  --  136*   Cardiac Enzymes:  Recent Labs Lab 12/31/16  2341 01/01/17 1047  TROPONINI <0.03 <0.03   BNP (last 3 results) No results for input(s): BNP in the last 8760 hours.  ProBNP (last 3 results) No results for input(s): PROBNP in the last 8760 hours.  CBG: No results for input(s): GLUCAP in the last 168 hours.  Recent Results (from the past 240 hour(s))  MRSA PCR Screening     Status: None   Collection Time: 12/31/16 10:42 PM  Result Value Ref Range Status   MRSA by PCR NEGATIVE NEGATIVE Final    Comment:        The GeneXpert MRSA Assay (FDA approved for NASAL specimens only), is one component of a comprehensive MRSA colonization surveillance program. It is not intended to diagnose MRSA infection nor to guide or monitor treatment for MRSA infections.      Studies: X-ray Chest Pa And Lateral  Result Date: 01/01/2017 CLINICAL DATA:  Chest pain. EXAM: CHEST  2 VIEW COMPARISON:  Radiographs 02/22/2009 FINDINGS: Low lung volumes. The cardiomediastinal contours  are normal. Pulmonary vasculature is normal. No consolidation, pleural effusion, or pneumothorax. No acute osseous abnormalities are seen. IMPRESSION: Low lung volumes without acute abnormality. Electronically Signed   By: Rubye Oaks M.D.   On: 01/01/2017 00:34    Scheduled Meds: . folic acid  1 mg Oral Daily  . multivitamin with minerals  1 tablet Oral Daily  . sucralfate  1 g Oral QID  . thiamine  100 mg Oral Daily   Or  . thiamine  100 mg Intravenous Daily   Continuous Infusions: . 0.9 % NaCl with KCl 20 mEq / L 100 mL/hr at 01/01/17 0051  . pantoprozole (PROTONIX) infusion 8 mg/hr (01/01/17 1610)    Principal Problem:   GI bleeding Active Problems:   Anemia   Hypotension   Hyperglycemia   Renal insufficiency    Time spent: 20    Haydee Salter  [facility] Pager AMION. If 7PM-7AM, please contact night-coverage at [email or web address], password [facility] 01/01/2017, 6:28 PM  LOS: 1 day

## 2017-01-02 DIAGNOSIS — Z789 Other specified health status: Secondary | ICD-10-CM | POA: Diagnosis present

## 2017-01-02 LAB — CBC WITH DIFFERENTIAL/PLATELET
Basophils Absolute: 0 10*3/uL (ref 0.0–0.1)
Basophils Relative: 0 %
EOS ABS: 0 10*3/uL (ref 0.0–0.7)
Eosinophils Relative: 1 %
HCT: 25.5 % — ABNORMAL LOW (ref 39.0–52.0)
HEMOGLOBIN: 9 g/dL — AB (ref 13.0–17.0)
LYMPHS ABS: 1.2 10*3/uL (ref 0.7–4.0)
Lymphocytes Relative: 17 %
MCH: 31.9 pg (ref 26.0–34.0)
MCHC: 35.3 g/dL (ref 30.0–36.0)
MCV: 90.4 fL (ref 78.0–100.0)
MONO ABS: 0.3 10*3/uL (ref 0.1–1.0)
MONOS PCT: 5 %
NEUTROS PCT: 77 %
Neutro Abs: 5.3 10*3/uL (ref 1.7–7.7)
Platelets: 141 10*3/uL — ABNORMAL LOW (ref 150–400)
RBC: 2.82 MIL/uL — ABNORMAL LOW (ref 4.22–5.81)
RDW: 13.1 % (ref 11.5–15.5)
WBC: 6.8 10*3/uL (ref 4.0–10.5)

## 2017-01-02 LAB — CBC
HEMATOCRIT: 24.7 % — AB (ref 39.0–52.0)
HEMOGLOBIN: 8.7 g/dL — AB (ref 13.0–17.0)
MCH: 31.9 pg (ref 26.0–34.0)
MCHC: 35.2 g/dL (ref 30.0–36.0)
MCV: 90.5 fL (ref 78.0–100.0)
Platelets: 134 10*3/uL — ABNORMAL LOW (ref 150–400)
RBC: 2.73 MIL/uL — ABNORMAL LOW (ref 4.22–5.81)
RDW: 13.3 % (ref 11.5–15.5)
WBC: 6.9 10*3/uL (ref 4.0–10.5)

## 2017-01-02 LAB — ECHOCARDIOGRAM COMPLETE
Height: 66 in
Weight: 2320 oz

## 2017-01-02 LAB — BASIC METABOLIC PANEL
Anion gap: 5 (ref 5–15)
BUN: 8 mg/dL (ref 6–20)
CHLORIDE: 106 mmol/L (ref 101–111)
CO2: 25 mmol/L (ref 22–32)
CREATININE: 0.76 mg/dL (ref 0.61–1.24)
Calcium: 7.9 mg/dL — ABNORMAL LOW (ref 8.9–10.3)
GFR calc Af Amer: >60 mL/min (ref >60)
GFR calc non Af Amer: >60 mL/min (ref >60)
Glucose, Bld: 85 mg/dL (ref 65–99)
Potassium: 3.5 mmol/L (ref 3.5–5.1)
SODIUM: 136 mmol/L (ref 135–145)

## 2017-01-02 MED ORDER — PANTOPRAZOLE SODIUM 40 MG PO TBEC
40.0000 mg | DELAYED_RELEASE_TABLET | Freq: Two times a day (BID) | ORAL | Status: DC
  Administered 2017-01-02 – 2017-01-03 (×2): 40 mg via ORAL
  Filled 2017-01-02 (×2): qty 1

## 2017-01-02 NOTE — Progress Notes (Addendum)
Subjective: The patient was seen and examined at bedside. He reports mild abdominal pain on the right upper quadrant 3-4 out of 10 in intensity, not associated with nausea or vomiting. He is hungry and wants to eat solids. Has not had a bowel movement for 3 days.  Objective: Vital signs in last 24 hours: Temp:  [97.8 F (36.6 C)-98.5 F (36.9 C)] 98.2 F (36.8 C) (09/28 0340) Pulse Rate:  [72] 72 (09/27 1112) Resp:  [12-18] 16 (09/28 0300) BP: (83-96)/(62-66) 83/63 (09/28 0300) Weight:  [67.6 kg (149 lb 0.5 oz)] 67.6 kg (149 lb 0.5 oz) (09/28 0340) Weight change: -1.814 kg (-4 lb) Last BM Date: 01/02/17  PE:he is comfortable and not in acute distress GENERAL:mild pallor, no icterus ABDOMEN:soft, nondistended, nontender, normoactive bowel sounds EXTREMITIES:no obvious deformity  Lab Results: Results for orders placed or performed during the hospital encounter of 12/31/16 (from the past 48 hour(s))  Comprehensive metabolic panel     Status: Abnormal   Collection Time: 12/31/16  5:17 PM  Result Value Ref Range   Sodium 138 135 - 145 mmol/L   Potassium 3.5 3.5 - 5.1 mmol/L   Chloride 104 101 - 111 mmol/L   CO2 25 22 - 32 mmol/L   Glucose, Bld 166 (H) 65 - 99 mg/dL   BUN 52 (H) 6 - 20 mg/dL   Creatinine, Ser 1.29 (H) 0.61 - 1.24 mg/dL   Calcium 8.6 (L) 8.9 - 10.3 mg/dL   Total Protein 6.3 (L) 6.5 - 8.1 g/dL   Albumin 3.3 (L) 3.5 - 5.0 g/dL   AST 23 15 - 41 U/L   ALT 18 17 - 63 U/L   Alkaline Phosphatase 65 38 - 126 U/L   Total Bilirubin 0.9 0.3 - 1.2 mg/dL   GFR calc non Af Amer 59 (L) >60 mL/min   GFR calc Af Amer >60 >60 mL/min    Comment: (NOTE) The eGFR has been calculated using the CKD EPI equation. This calculation has not been validated in all clinical situations. eGFR's persistently <60 mL/min signify possible Chronic Kidney Disease.    Anion gap 9 5 - 15  CBC     Status: Abnormal   Collection Time: 12/31/16  5:17 PM  Result Value Ref Range   WBC 8.6 4.0 -  10.5 K/uL   RBC 3.88 (L) 4.22 - 5.81 MIL/uL   Hemoglobin 12.2 (L) 13.0 - 17.0 g/dL   HCT 35.1 (L) 39.0 - 52.0 %   MCV 90.5 78.0 - 100.0 fL   MCH 31.4 26.0 - 34.0 pg   MCHC 34.8 30.0 - 36.0 g/dL   RDW 13.0 11.5 - 15.5 %   Platelets 182 150 - 400 K/uL  Type and screen [place]     Status: None (Preliminary result)   Collection Time: 12/31/16  5:17 PM  Result Value Ref Range   ABO/RH(D) O POS    Antibody Screen NEG    Sample Expiration 01/03/2017    Unit Number [id number]    Blood Component Type RED CELLS,LR    Unit division 00    Status of Unit ALLOCATED    Transfusion Status OK TO TRANSFUSE    Crossmatch Result Compatible    Unit Number [id number]    Blood Component Type RED CELLS,LR    Unit division 00    Status of Unit ALLOCATED    Transfusion Status OK TO TRANSFUSE    Crossmatch Result Compatible   ABO/Rh     Status: None  Collection Time: 12/31/16  5:17 PM  Result Value Ref Range   ABO/RH(D) O POS   I-Stat CG4 Lactic Acid, ED     Status: None   Collection Time: 12/31/16  5:25 PM  Result Value Ref Range   Lactic Acid, Venous 1.58 0.5 - 1.9 mmol/L  I-Stat Chem 8, ED     Status: Abnormal   Collection Time: 12/31/16  5:25 PM  Result Value Ref Range   Sodium 140 135 - 145 mmol/L   Potassium 3.4 (L) 3.5 - 5.1 mmol/L   Chloride 101 101 - 111 mmol/L   BUN 49 (H) 6 - 20 mg/dL   Creatinine, Ser 1.20 0.61 - 1.24 mg/dL   Glucose, Bld 171 (H) 65 - 99 mg/dL   Calcium, Ion 1.11 (L) 1.15 - 1.40 mmol/L   TCO2 24 22 - 32 mmol/L   Hemoglobin 11.9 (L) 13.0 - 17.0 g/dL   HCT 35.0 (L) 39.0 - 52.0 %  Protime-INR     Status: None   Collection Time: 12/31/16  5:26 PM  Result Value Ref Range   Prothrombin Time 14.0 11.4 - 15.2 seconds   INR 1.09   Lipase, blood     Status: None   Collection Time: 12/31/16  5:26 PM  Result Value Ref Range   Lipase 35 11 - 51 U/L  POC occult blood, ED     Status: Abnormal   Collection Time: 12/31/16  5:30 PM  Result Value  Ref Range   Fecal Occult Bld POSITIVE (A) NEGATIVE  Prepare RBC     Status: None   Collection Time: 12/31/16  7:00 PM  Result Value Ref Range   Order Confirmation ORDER PROCESSED BY BLOOD BANK   I-Stat CG4 Lactic Acid, ED     Status: None   Collection Time: 12/31/16  9:23 PM  Result Value Ref Range   Lactic Acid, Venous 0.96 0.5 - 1.9 mmol/L  MRSA PCR Screening     Status: None   Collection Time: 12/31/16 10:42 PM  Result Value Ref Range   MRSA by PCR NEGATIVE NEGATIVE    Comment:        The GeneXpert MRSA Assay (FDA approved for NASAL specimens only), is one component of a comprehensive MRSA colonization surveillance program. It is not intended to diagnose MRSA infection nor to guide or monitor treatment for MRSA infections.   Cortisol     Status: None   Collection Time: 12/31/16 11:41 PM  Result Value Ref Range   Cortisol, Plasma 3.6 ug/dL    Comment: (NOTE) AM    6.7 - 22.6 ug/dL PM   <10.0       ug/dL   HIV antibody (Routine Testing)     Status: None   Collection Time: 12/31/16 11:41 PM  Result Value Ref Range   HIV Screen 4th Generation wRfx Non Reactive Non Reactive    Comment: (NOTE) Performed At: [facility] [place], [place] [id number] Lindon Romp MD [id number]   Protime-INR     Status: None   Collection Time: 12/31/16 11:41 PM  Result Value Ref Range   Prothrombin Time 15.1 11.4 - 15.2 seconds   INR 1.20   APTT     Status: None   Collection Time: 12/31/16 11:41 PM  Result Value Ref Range   aPTT 33 24 - 36 seconds  Troponin I (q 6hr x 3)     Status: None   Collection Time: 12/31/16 11:41 PM  Result Value  Ref Range   Troponin I <0.03 <0.03 ng/mL  CBC     Status: Abnormal   Collection Time: 12/31/16 11:41 PM  Result Value Ref Range   WBC 5.6 4.0 - 10.5 K/uL   RBC 2.97 (L) 4.22 - 5.81 MIL/uL   Hemoglobin 9.3 (L) 13.0 - 17.0 g/dL    Comment: REPEATED TO VERIFY   HCT 27.1 (L) 39.0 - 52.0 %   MCV 91.2 78.0 - 100.0 fL    MCH 31.3 26.0 - 34.0 pg   MCHC 34.3 30.0 - 36.0 g/dL   RDW 13.3 11.5 - 15.5 %   Platelets 145 (L) 150 - 400 K/uL  I-STAT, chem 8     Status: Abnormal   Collection Time: 01/01/17  8:59 AM  Result Value Ref Range   Sodium 143 135 - 145 mmol/L   Potassium 3.9 3.5 - 5.1 mmol/L   Chloride 109 101 - 111 mmol/L   BUN 24 (H) 6 - 20 mg/dL   Creatinine, Ser 0.70 0.61 - 1.24 mg/dL   Glucose, Bld 83 65 - 99 mg/dL   Calcium, Ion 1.16 1.15 - 1.40 mmol/L   TCO2 23 22 - 32 mmol/L   Hemoglobin 8.8 (L) 13.0 - 17.0 g/dL   HCT 26.0 (L) 39.0 - 52.0 %  Comprehensive metabolic panel     Status: Abnormal   Collection Time: 01/01/17 10:47 AM  Result Value Ref Range   Sodium 139 135 - 145 mmol/L   Potassium 3.8 3.5 - 5.1 mmol/L   Chloride 111 101 - 111 mmol/L   CO2 20 (L) 22 - 32 mmol/L   Glucose, Bld 80 65 - 99 mg/dL   BUN 21 (H) 6 - 20 mg/dL   Creatinine, Ser 0.76 0.61 - 1.24 mg/dL   Calcium 7.8 (L) 8.9 - 10.3 mg/dL   Total Protein 5.3 (L) 6.5 - 8.1 g/dL   Albumin 2.9 (L) 3.5 - 5.0 g/dL   AST 18 15 - 41 U/L   ALT 13 (L) 17 - 63 U/L   Alkaline Phosphatase 50 38 - 126 U/L   Total Bilirubin 0.5 0.3 - 1.2 mg/dL   GFR calc non Af Amer >60 >60 mL/min   GFR calc Af Amer >60 >60 mL/min    Comment: (NOTE) The eGFR has been calculated using the CKD EPI equation. This calculation has not been validated in all clinical situations. eGFR's persistently <60 mL/min signify possible Chronic Kidney Disease.    Anion gap 8 5 - 15  CBC     Status: Abnormal   Collection Time: 01/01/17 10:47 AM  Result Value Ref Range   WBC 7.9 4.0 - 10.5 K/uL   RBC 2.80 (L) 4.22 - 5.81 MIL/uL   Hemoglobin 9.0 (L) 13.0 - 17.0 g/dL   HCT 25.4 (L) 39.0 - 52.0 %   MCV 90.7 78.0 - 100.0 fL   MCH 32.1 26.0 - 34.0 pg   MCHC 35.4 30.0 - 36.0 g/dL   RDW 13.3 11.5 - 15.5 %   Platelets 136 (L) 150 - 400 K/uL  Troponin I (q 6hr x 3)     Status: None   Collection Time: 01/01/17 10:47 AM  Result Value Ref Range   Troponin I <0.03  <0.03 ng/mL  Hemoglobin A1c     Status: None   Collection Time: 01/01/17 10:47 AM  Result Value Ref Range   Hgb A1c MFr Bld 5.2 4.8 - 5.6 %    Comment: (NOTE) Pre diabetes:  5.7%-6.4% Diabetes:              >6.4% Glycemic control for   <7.0% adults with diabetes    Mean Plasma Glucose 102.54 mg/dL  CBC     Status: Abnormal   Collection Time: 01/01/17  6:20 PM  Result Value Ref Range   WBC 7.6 4.0 - 10.5 K/uL   RBC 2.73 (L) 4.22 - 5.81 MIL/uL   Hemoglobin 8.6 (L) 13.0 - 17.0 g/dL   HCT 25.0 (L) 39.0 - 52.0 %   MCV 91.6 78.0 - 100.0 fL   MCH 31.5 26.0 - 34.0 pg   MCHC 34.4 30.0 - 36.0 g/dL   RDW 13.3 11.5 - 15.5 %   Platelets 131 (L) 150 - 400 K/uL  Differential     Status: None   Collection Time: 01/01/17  6:20 PM  Result Value Ref Range   Neutrophils Relative % 70 %   Neutro Abs 5.6 1.7 - 7.7 K/uL   Lymphocytes Relative 22 %   Lymphs Abs 1.7 0.7 - 4.0 K/uL   Monocytes Relative 7 %   Monocytes Absolute 0.5 0.1 - 1.0 K/uL   Eosinophils Relative 1 %   Eosinophils Absolute 0.0 0.0 - 0.7 K/uL   Basophils Relative 0 %   Basophils Absolute 0.0 0.0 - 0.1 K/uL  Glucose, capillary     Status: Abnormal   Collection Time: 01/01/17  7:36 PM  Result Value Ref Range   Glucose-Capillary 118 (H) 65 - 99 mg/dL  Glucose, capillary     Status: None   Collection Time: 01/01/17 11:32 PM  Result Value Ref Range   Glucose-Capillary 80 65 - 99 mg/dL  CBC     Status: Abnormal   Collection Time: 01/01/17 11:40 PM  Result Value Ref Range   WBC 6.9 4.0 - 10.5 K/uL   RBC 2.73 (L) 4.22 - 5.81 MIL/uL   Hemoglobin 8.7 (L) 13.0 - 17.0 g/dL   HCT 24.7 (L) 39.0 - 52.0 %   MCV 90.5 78.0 - 100.0 fL   MCH 31.9 26.0 - 34.0 pg   MCHC 35.2 30.0 - 36.0 g/dL   RDW 13.3 11.5 - 15.5 %   Platelets 134 (L) 150 - 400 K/uL  CBC with Differential/Platelet     Status: Abnormal   Collection Time: 01/02/17  9:10 AM  Result Value Ref Range   WBC 6.8 4.0 - 10.5 K/uL   RBC 2.82 (L) 4.22 - 5.81 MIL/uL    Hemoglobin 9.0 (L) 13.0 - 17.0 g/dL   HCT 25.5 (L) 39.0 - 52.0 %   MCV 90.4 78.0 - 100.0 fL   MCH 31.9 26.0 - 34.0 pg   MCHC 35.3 30.0 - 36.0 g/dL   RDW 13.1 11.5 - 15.5 %   Platelets 141 (L) 150 - 400 K/uL   Neutrophils Relative % 77 %   Neutro Abs 5.3 1.7 - 7.7 K/uL   Lymphocytes Relative 17 %   Lymphs Abs 1.2 0.7 - 4.0 K/uL   Monocytes Relative 5 %   Monocytes Absolute 0.3 0.1 - 1.0 K/uL   Eosinophils Relative 1 %   Eosinophils Absolute 0.0 0.0 - 0.7 K/uL   Basophils Relative 0 %   Basophils Absolute 0.0 0.0 - 0.1 K/uL  Basic metabolic panel     Status: Abnormal   Collection Time: 01/02/17  9:10 AM  Result Value Ref Range   Sodium 136 135 - 145 mmol/L   Potassium 3.5 3.5 - 5.1 mmol/L  Chloride 106 101 - 111 mmol/L   CO2 25 22 - 32 mmol/L   Glucose, Bld 85 65 - 99 mg/dL   BUN 8 6 - 20 mg/dL   Creatinine, Ser 0.76 0.61 - 1.24 mg/dL   Calcium 7.9 (L) 8.9 - 10.3 mg/dL   GFR calc non Af Amer >60 >60 mL/min   GFR calc Af Amer >60 >60 mL/min    Comment: (NOTE) The eGFR has been calculated using the CKD EPI equation. This calculation has not been validated in all clinical situations. eGFR's persistently <60 mL/min signify possible Chronic Kidney Disease.    Anion gap 5 5 - 15    Studies/Results: X-ray Chest Pa And Lateral  Result Date: 01/01/2017 CLINICAL DATA:  Chest pain. EXAM: CHEST  2 VIEW COMPARISON:  Radiographs 02/22/2009 FINDINGS: Low lung volumes. The cardiomediastinal contours are normal. Pulmonary vasculature is normal. No consolidation, pleural effusion, or pneumothorax. No acute osseous abnormalities are seen. IMPRESSION: Low lung volumes without acute abnormality. Electronically Signed   By: Jeb Levering M.D.   On: 01/01/2017 00:34    Medications: I have reviewed the patient's current medications.  Assessment: Duodenal ulcer noted on endoscopy,no evidence of active or recent bleeding and considered fairly low risk for rebleeding. Patient remains slightly  hypotensive (83/36mHg, heart rate 72/m),received 1 unit of PRBC on 12/31/2016, remains on IV Protonix drip. BUN was 24 and admission and has normalized to 8 today.   Plan: 1.Start PO diet  2.Protonix 40 mg IV(while in the hospital) or by mouth twice a day, to be continued at least for 6 weeks, Avoid NSAIDs and alcohol. Continue sucralfate 1 g 4 times a day while in the hospital. On discharge patient advised to take sucralfate 1 g twice a day for 4 weeks.  3. If hemoglobin remains stable, no further hematemesis or melena noted, and remains hemodynamically stable, okay to discharge in a.m. Tomorrow.  Follow up with [facility] in 2 weeks after discharge- Dr.Kamen Hanken    ARonnette Juniper9/28/2018, 10:38 AM   Pager [phone] no answer or after 5 PM call [phone]

## 2017-01-02 NOTE — Progress Notes (Signed)
[facility] PROGRESS NOTE  Penny Frisbie WUJ:[id number] DOB: Mar 09, 1956 DOA: 12/31/2016 PCP: Patient, No Pcp Per Interpreter USED   Assessment/Plan:  Hypotension, resolved Tele tropi q6h x3 Hydrate with ns iv Serial cbc  Chest pain Tele Trop I q6h x3 Check echo- > no acute changes seen R/o complete  Upper gi bleeding protonix  iv x1 then  /hour-->BID Reg diet, will monitor patient and then discharge in the morning GI consult much appreciated  Anemia Serial cbc  Hypokalemia,, resolved Repleted   Renal insufficiency, resolved Hydrated  Hyperglycemia Check hga1c-->5.2   DVT Prophylaxis  SCDs   AM Labs Ordered, also please review Full Orders  Family Communication: None at bedside  Code Status FULL CODE  Likely DC to  home  Condition Critical  Consults called: GI per ED  Admission status: inpatient  Consultants:  GI  Procedures:  EGD  Antibiotics:  none (indicate start date, and stop date if known)  HPI/Subjective: Plan mild epigastric abdominal discomfort. Chest shortness breath nausea or vomiting. Also states that he is tired post procedure.  Objective: Vitals:   01/02/17 1535 01/02/17 1600  BP: 91/72 92/69  Pulse: 79 80  Resp: 19 18  Temp: 97.8 F (36.6 C)   SpO2: 99% 99%    Intake/Output Summary (Last 24 hours) at 01/02/17 1813 Last data filed at 01/02/17 1221  Gross per 24 hour  Intake              325 ml  Output             1450 ml  Net            -1125 ml   Filed Weights   12/31/16 2245 01/01/17 0816 01/02/17 0340  Weight: 65.8 kg (145 lb 1 oz) 65.8 kg (145 lb) 67.6 kg (149 lb 0.5 oz)    Exam:  Physical Exam  Constitutional: He is oriented to person, place, and time. Hhe appears well-developed and well-nourished.  Cardiovascular: Normal rate and regular rhythm.   Pulmonary/Chest: Effort normal and breath sounds normal. No respiratory distress.   Abdominal: Soft. Bowel sounds are  normal. Diffuse tenderness.  Neurological: He is alert and oriented to person, place, and time. No cranial nerve deficit.     Data Reviewed: Basic Metabolic Panel:  Recent Labs Lab 12/31/16 1717 12/31/16 1725 01/01/17 0859 01/01/17 1047 01/02/17 0910  NA 138 140 143 139 136  K 3.5 3.4* 3.9 3.8 3.5  CL 104 101 109 111 106  CO2 25  --   --  20* 25  GLUCOSE 166* 171* 83 80 85  BUN 52* 49* 24* 21* 8  CREATININE 1.29* 1.20 0.70 0.76 0.76  CALCIUM 8.6*  --   --  7.8* 7.9*   Liver Function Tests:  Recent Labs Lab 12/31/16 1717 01/01/17 1047  AST 23 18  ALT 18 13*  ALKPHOS 65 50  BILITOT 0.9 0.5  PROT 6.3* 5.3*  ALBUMIN 3.3* 2.9*    Recent Labs Lab 12/31/16 1726  LIPASE 35   No results for input(s): AMMONIA in the last 168 hours. CBC:  Recent Labs Lab 12/31/16 2341 01/01/17 0859 01/01/17 1047 01/01/17 1820 01/01/17 2340 01/02/17 0910  WBC 5.6  --  7.9 7.6 6.9 6.8  NEUTROABS  --   --   --  5.6  --  5.3  HGB 9.3* 8.8* 9.0* 8.6* 8.7* 9.0*  HCT 27.1* 26.0* 25.4* 25.0* 24.7* 25.5*  MCV 91.2  --  90.7 91.6 90.5 90.4  PLT 145*  --  136* 131* 134* 141*   Cardiac Enzymes:  Recent Labs Lab 12/31/16 2341 01/01/17 1047  TROPONINI <0.03 <0.03   BNP (last 3 results) No results for input(s): BNP in the last 8760 hours.  ProBNP (last 3 results) No results for input(s): PROBNP in the last 8760 hours.  CBG:  Recent Labs Lab 01/01/17 1936 01/01/17 2332  GLUCAP 118* 80    Recent Results (from the past 240 hour(s))  MRSA PCR Screening     Status: None   Collection Time: 12/31/16 10:42 PM  Result Value Ref Range Status   MRSA by PCR NEGATIVE NEGATIVE Final    Comment:        The GeneXpert MRSA Assay (FDA approved for NASAL specimens only), is one component of a comprehensive MRSA colonization surveillance program. It is not intended to diagnose MRSA infection nor to guide or monitor treatment for MRSA infections.      Studies: X-ray Chest Pa  And Lateral  Result Date: 01/01/2017 CLINICAL DATA:  Chest pain. EXAM: CHEST  2 VIEW COMPARISON:  Radiographs 02/22/2009 FINDINGS: Low lung volumes. The cardiomediastinal contours are normal. Pulmonary vasculature is normal. No consolidation, pleural effusion, or pneumothorax. No acute osseous abnormalities are seen. IMPRESSION: Low lung volumes without acute abnormality. Electronically Signed   By: Rubye Oaks M.D.   On: 01/01/2017 00:34    Scheduled Meds: . folic acid  1 mg Oral Daily  . multivitamin with minerals  1 tablet Oral Daily  . pantoprazole  40 mg Oral BID AC  . sucralfate  1 g Oral QID  . thiamine  100 mg Oral Daily   Or  . thiamine  100 mg Intravenous Daily   Continuous Infusions:   Principal Problem:   GI bleeding Active Problems:   Anemia   Hypotension   Hyperglycemia   Renal insufficiency   Non-English speaking patient    Time spent: 20    Haydee Salter  [facility] Pager AMION. If 7PM-7AM, please contact night-coverage at [email or web address], password [facility] 01/02/2017, 6:13 PM  LOS: 2 days

## 2017-01-03 LAB — CBC WITH DIFFERENTIAL/PLATELET
BASOS ABS: 0 10*3/uL (ref 0.0–0.1)
Basophils Relative: 0 %
EOS ABS: 0.1 10*3/uL (ref 0.0–0.7)
Eosinophils Relative: 1 %
HCT: 25.7 % — ABNORMAL LOW (ref 39.0–52.0)
Hemoglobin: 9 g/dL — ABNORMAL LOW (ref 13.0–17.0)
Lymphocytes Relative: 22 %
Lymphs Abs: 1.5 10*3/uL (ref 0.7–4.0)
MCH: 31.3 pg (ref 26.0–34.0)
MCHC: 35 g/dL (ref 30.0–36.0)
MCV: 89.2 fL (ref 78.0–100.0)
Monocytes Absolute: 0.6 10*3/uL (ref 0.1–1.0)
Monocytes Relative: 8 %
Neutro Abs: 4.8 10*3/uL (ref 1.7–7.7)
Neutrophils Relative %: 69 %
PLATELETS: 156 10*3/uL (ref 150–400)
RBC: 2.88 MIL/uL — AB (ref 4.22–5.81)
RDW: 12.6 % (ref 11.5–15.5)
WBC: 6.9 10*3/uL (ref 4.0–10.5)

## 2017-01-03 MED ORDER — SUCRALFATE 1 G PO TABS: 1.0000 g | ORAL_TABLET | Freq: Two times a day (BID) | ORAL | 0 refills | Status: DC

## 2017-01-03 MED ORDER — PANTOPRAZOLE SODIUM 40 MG PO TBEC: 40.0000 mg | DELAYED_RELEASE_TABLET | Freq: Two times a day (BID) | ORAL | 0 refills | Status: DC

## 2017-01-03 MED ORDER — FERROUS GLUCONATE 324 (38 FE) MG PO TABS: 324.0000 mg | ORAL_TABLET | Freq: Every day | ORAL | 0 refills | Status: AC

## 2017-01-03 NOTE — Discharge Summary (Signed)
Physician Discharge Summary  Justin Ballard ZOX:[id number] DOB: May 04, 1955 DOA: 12/31/2016  PCP: Patient, No Pcp Per  Admit date: 12/31/2016 Discharge date: 01/03/2017  Time spent: 35 minutes  Recommendations for Outpatient Follow-up:  F/u with GI OP F/u with PCP OP  Used phone interpreter  Discharge Diagnoses:  Principal Problem:   GI bleeding Active Problems:   Anemia   Hypotension   Hyperglycemia   Renal insufficiency   Non-English speaking patient   Discharge Condition: stable  Diet recommendation:   Filed Weights   12/31/16 2245 01/01/17 0816 01/02/17 0340  Weight: 65.8 kg (145 lb 1 oz) 65.8 kg (145 lb) 67.6 kg (149 lb 0.5 oz)    History of present illness:  Justin Ballard  is a 61 y.o. male, w Justin Ballard c/o n/v, with bloody emesis x 1 earlier today. Slight epigastric discomfort.  Slight diarrhea x2.   + black stool.  Slight radiation of the epigastric pain to the chest.  Sharp, without radiation.  Pt denies fever, chills, cough , palp, sob.  Pt noltes took 2 aspirin the day before otherwise denies NSAID USE or prior hx of PUD or gi bleeding. Pt notes he drinks 6 beers per day.   In ED,  Hgb 12.2,  Bun/creat 52/1.29, K=3.4 , Glucose 166, ED physician consulted critical care initially due to soft bp and then consulted medicine and GI,  Pt will be admitted for GI bleeding.   Hospital Course:  Given fluids for low BP. Transfused 2u prbcs. GI did EGD and found an ulcer. Sulcrafate added to pt's protonix regimen. Did well post procedure. Eating well and d/c home.  Triple neg trop. ECHO neg for wall motion abn.  Procedures:  EGD - Dr. Marca Ancona - LG ulcer duodenal  ECHO Study Conclusions  - Left ventricle: The cavity size was normal. Systolic function was   normal. The estimated ejection fraction was in the range of 55%   to 60%. Wall motion was normal; there were no regional wall   motion abnormalities. Left ventricular diastolic function   parameters  were normal. - Right ventricle: The cavity size was mildly dilated. Wall   thickness was normal. - Right atrium: The atrium was mildly dilated.   Consultations:  GI  Discharge Exam: Vitals:   01/03/17 0500 01/03/17 0811  BP: 91/66 97/65  Pulse: 66   Resp: 16   Temp:  98.2 F (36.8 C)  SpO2: 99%     General: NCAT, NAD Cardiovascular: RRR, no MRG Respiratory: CTAB, nl wob  Discharge Instructions    Discharge Medication List as of 01/03/2017 11:26 AM    START taking these medications   Details  ferrous gluconate (FERGON) 324 MG tablet Take 1 tablet (324 mg total) by mouth daily with breakfast., Starting Sat 01/03/2017, Until Sat 01/31/2017, Normal    pantoprazole (PROTONIX) 40 MG tablet Take 1 tablet (40 mg total) by mouth 2 (two) times daily before a meal., Starting Sat 01/03/2017, Until Mon 02/02/2017, Normal    sucralfate (CARAFATE) 1 g tablet Take 1 tablet (1 g total) by mouth 2 (two) times daily., Starting Sat 01/03/2017, Until Sat 01/31/2017, Normal      CONTINUE these medications which have NOT CHANGED   Details  docusate sodium (COLACE) 50 MG capsule Take 1 capsule (50 mg total) by mouth 2 (two) times daily., Starting Tue 12/30/2016, Normal      STOP taking these medications     polyethylene glycol (MIRALAX) packet        No  Known Allergies Follow-up Information    [facility] Follow up on 01/26/2017.   Why:  9 am for hospital follow up Contact information: [address] [place] [address] [id number] mc [facility] [address] [phone]           The results of significant diagnostics from this hospitalization (including imaging, microbiology, ancillary and laboratory) are listed below for reference.    Significant Diagnostic Studies: X-ray Chest Pa And Lateral  Result Date: 01/01/2017 CLINICAL DATA:  Chest pain. EXAM: CHEST  2 VIEW COMPARISON:  Radiographs 02/22/2009 FINDINGS: Low lung volumes. The cardiomediastinal contours  are normal. Pulmonary vasculature is normal. No consolidation, pleural effusion, or pneumothorax. No acute osseous abnormalities are seen. IMPRESSION: Low lung volumes without acute abnormality. Electronically Signed   By: Rubye Oaks M.D.   On: 01/01/2017 00:34   Dg Abd 1 View  Result Date: 12/30/2016 CLINICAL DATA:  Abdominal pain. EXAM: ABDOMEN - 1 VIEW COMPARISON:  No recent prior. FINDINGS: Soft tissue structures are unremarkable. No bowel distention. No free air. No acute bony abnormality. Small calcific density noted adjacent to the left L4 vertebral body. This may be related to adjacent lumbar spine degenerative disease. Small left ureteral stone cannot be excluded. Diffuse prominent degenerative change lumbar spine. No acute bony abnormality. IMPRESSION: 1.  No bowel distention or free air. 2. Small calcific density noted adjacent to the left portion of L4, possibly related to degenerative disease. Small left ureteral stone cannot be excluded. Electronically Signed   By: Maisie Fus  Register   On: 12/30/2016 11:28    Microbiology: Recent Results (from the past 240 hour(s))  MRSA PCR Screening     Status: None   Collection Time: 12/31/16 10:42 PM  Result Value Ref Range Status   MRSA by PCR NEGATIVE NEGATIVE Final    Comment:        The GeneXpert MRSA Assay (FDA approved for NASAL specimens only), is one component of a comprehensive MRSA colonization surveillance program. It is not intended to diagnose MRSA infection nor to guide or monitor treatment for MRSA infections.      Labs: Basic Metabolic Panel:  Recent Labs Lab 12/31/16 1717 12/31/16 1725 01/01/17 0859 01/01/17 1047 01/02/17 0910  NA 138 140 143 139 136  K 3.5 3.4* 3.9 3.8 3.5  CL 104 101 109 111 106  CO2 25  --   --  20* 25  GLUCOSE 166* 171* 83 80 85  BUN 52* 49* 24* 21* 8  CREATININE 1.29* 1.20 0.70 0.76 0.76  CALCIUM 8.6*  --   --  7.8* 7.9*   Liver Function Tests:  Recent Labs Lab 12/31/16 1717  01/01/17 1047  AST 23 18  ALT 18 13*  ALKPHOS 65 50  BILITOT 0.9 0.5  PROT 6.3* 5.3*  ALBUMIN 3.3* 2.9*    Recent Labs Lab 12/31/16 1726  LIPASE 35   No results for input(s): AMMONIA in the last 168 hours. CBC:  Recent Labs Lab 01/01/17 1047 01/01/17 1820 01/01/17 2340 01/02/17 0910 01/03/17 0514  WBC 7.9 7.6 6.9 6.8 6.9  NEUTROABS  --  5.6  --  5.3 4.8  HGB 9.0* 8.6* 8.7* 9.0* 9.0*  HCT 25.4* 25.0* 24.7* 25.5* 25.7*  MCV 90.7 91.6 90.5 90.4 89.2  PLT 136* 131* 134* 141* 156   Cardiac Enzymes:  Recent Labs Lab 12/31/16 2341 01/01/17 1047  TROPONINI <0.03 <0.03   BNP: BNP (last 3 results) No results for input(s): BNP in the last 8760  hours.  ProBNP (last 3 results) No results for input(s): PROBNP in the last 8760 hours.  CBG:  Recent Labs Lab 01/01/17 1936 01/01/17 2332  GLUCAP 118* 80       Signed:  Haydee Salter MD  FACP  [facility] 01/03/2017, 9:38 AM

## 2017-01-03 NOTE — Progress Notes (Signed)
Patient discharged to home. Discharge instructions reviewed with assistance of video interpreter. Patient had no questions at time of discharge. All belongings returned. VSS, no c/o pain or distress.

## 2017-01-04 LAB — TYPE AND SCREEN
ABO/RH(D): O POS
ANTIBODY SCREEN: NEGATIVE
UNIT DIVISION: 0
Unit division: 0

## 2017-01-04 LAB — BPAM RBC
Blood Product Expiration Date: 201810182359
Blood Product Expiration Date: 201810182359
Unit Type and Rh: 5100
Unit Type and Rh: 5100

## 2017-01-26 ENCOUNTER — Ambulatory Visit (INDEPENDENT_AMBULATORY_CARE_PROVIDER_SITE_OTHER): Payer: Self-pay | Admitting: Family Medicine

## 2017-01-26 ENCOUNTER — Encounter: Payer: Self-pay | Admitting: Family Medicine

## 2017-01-26 VITALS — BP 120/80 | HR 50 | Temp 97.5°F | Resp 16 | Ht 65.0 in | Wt 148.0 lb

## 2017-01-26 DIAGNOSIS — K269 Duodenal ulcer, unspecified as acute or chronic, without hemorrhage or perforation: Secondary | ICD-10-CM

## 2017-01-26 DIAGNOSIS — Z23 Encounter for immunization: Secondary | ICD-10-CM

## 2017-01-26 DIAGNOSIS — Z13220 Encounter for screening for lipoid disorders: Secondary | ICD-10-CM

## 2017-01-26 DIAGNOSIS — Z8719 Personal history of other diseases of the digestive system: Secondary | ICD-10-CM

## 2017-01-26 DIAGNOSIS — D638 Anemia in other chronic diseases classified elsewhere: Secondary | ICD-10-CM

## 2017-01-26 DIAGNOSIS — K219 Gastro-esophageal reflux disease without esophagitis: Secondary | ICD-10-CM

## 2017-01-26 DIAGNOSIS — Z87891 Personal history of nicotine dependence: Secondary | ICD-10-CM

## 2017-01-26 DIAGNOSIS — Z789 Other specified health status: Secondary | ICD-10-CM

## 2017-01-26 DIAGNOSIS — Z1159 Encounter for screening for other viral diseases: Secondary | ICD-10-CM

## 2017-01-26 MED ORDER — PANTOPRAZOLE SODIUM 40 MG PO TBEC: 40.0000 mg | DELAYED_RELEASE_TABLET | Freq: Two times a day (BID) | ORAL | 2 refills | Status: AC

## 2017-01-26 MED ORDER — SUCRALFATE 1 G PO TABS: 1.0000 g | ORAL_TABLET | Freq: Two times a day (BID) | ORAL | 0 refills | Status: AC

## 2017-01-26 NOTE — Patient Instructions (Addendum)
Will continue medications as previously prescription.  Constipation: Increase green leafy veggies, beans, and protein sources. Increase water intake to 4-5 bottles per day.  Will follow up by phone with laboratory results.     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 Con frecuencia, la causa es una cantidad insuficiente de hierro. Esto se denomina anemia por deficiencia de hierro y puede provocarle cansancio y dificultad para respirar. CUIDADOS EN EL HOGAR  Tome el hierro segn las indicaciones del mdico.  Tome las vitaminas segn las indicaciones del mdico.  Consuma alimentos que contengan hierro. Estos incluyen hgado, carne magra, pan de grano integral, huevos, frutas deshidratadas y vegetales de hojas color verde oscuro.  SOLICITE AYUDA DE INMEDIATO SI:  Pierde el conocimiento (se desmaya).  Siente dolor en el pecho.  Tiene malestar estomacal (nuseas) o vomita.  Tiene mucha dificultad para respirar cuando realiza actividades.  Se siente dbil.  La frecuencia cardaca est acelerada.  Comienza a sudar sin motivo.  Se siente mareado cuando se levanta de una silla o de la cama.  ASEGRESE DE QUE:  Comprende estas instrucciones.  Controlar su afeccin.  Recibir ayuda de inmediato si no mejora o si empeora.  Esta informacin no tiene Theme park managercomo fin reemplazar el consejo del mdico. Asegrese de hacerle al mdico cualquier pregunta que tenga. Document Released: 06/28/2010 Document Revised: 08/08/2014 Document Reviewed: 08/26/2015 Elsevier Interactive Patient Education  2017 [employer]

## 2017-01-26 NOTE — Progress Notes (Addendum)
Subjective:    Patient ID: Justin Ballard, male    DOB: 07/31/1955, 61 y.o.   MRN: [id number]  HPI Justin Ballard, a 61 hear old male presents to establish care. Patient primarily speaks spanish, used video interpreter to assist with communication. Patient was recently admitted to inpatient services on for a GI bleed. He was previously taking OTC NSAIDs, daily aspirin, and drinking 6 beers per day. Patient has also discontinued smoking. Patient underwent an EGD,which shows a large non-bleeding duodenal ulcer.  He was discharged from hospital on protonix and carafate. Symptoms have improved since hospital discharge.  Patient denies fatigue, shortness of breath, chest pains, rectal bleeding, nausea, vomiting, or diarrhea.  Past Medical History:  Diagnosis Date  . GERD (gastroesophageal reflux disease)    Social History   Social History  . Marital status: Single    Spouse name: N/A  . Number of children: N/A  . Years of education: N/A   Occupational History  . Not on file.   Social History Main Topics  . Smoking status: Former Smoker    Packs/day: 1.00    Years: 15.00    Types: Cigarettes  . Smokeless tobacco: Never Used  . Alcohol use No     Comment: quit  . Drug use: No  . Sexual activity: Not on file   Other Topics Concern  . Not on file   Social History Narrative  . No narrative on file  There is no immunization history for the selected administration types on file for this patient.  Review of Systems  Constitutional: Negative.   HENT: Negative.   Eyes: Negative for visual disturbance.  Respiratory: Negative.  Negative for cough, shortness of breath, wheezing and stridor.   Cardiovascular: Negative.   Gastrointestinal: Negative.   Endocrine: Negative.  Negative for polydipsia, polyphagia and polyuria.  Genitourinary: Negative.  Negative for genital sores.  Musculoskeletal: Negative.  Negative for myalgias, neck pain and neck stiffness.  Skin:  Negative.   Allergic/Immunologic: Negative.   Neurological: Negative.   Hematological: Negative.   Psychiatric/Behavioral: Negative.        Objective:   Physical Exam  Constitutional: He is oriented to person, place, and time.  HENT:  Head: Normocephalic and atraumatic.  Right Ear: External ear normal.  Mouth/Throat: Oropharynx is clear and moist.  Eyes: Pupils are equal, round, and reactive to light. Conjunctivae and EOM are normal.  Neck: Normal range of motion.  Cardiovascular: Normal rate, regular rhythm, normal heart sounds and intact distal pulses.   Pulmonary/Chest: Effort normal and breath sounds normal.  Abdominal: Soft. Bowel sounds are normal.  Musculoskeletal: Normal range of motion.  Neurological: He is alert and oriented to person, place, and time. He has normal reflexes.  Skin: Skin is warm and dry.  Psychiatric: He has a normal mood and affect. His behavior is normal. Judgment and thought content normal.         BP 120/80 (BP Location: Left Arm, Patient Position: Sitting, Cuff Size: Normal)   Pulse (!) 50   Temp (!) 97.5 F (36.4 C) (Oral)   Resp 16   Ht 5\' 5"  (1.651 m)   Wt 148 lb (67.1 kg)   SpO2 100%   BMI 24.63 kg/m  Assessment & Plan:  1. Need for hepatitis C screening test - Hepatitis C Antibody  2. Former smoker Patient states that he quit smoking after hospital discharge.  Patient has not had a desire to smoke.  3. Screening cholesterol level  - Lipid  Panel  4. Anemia in other chronic diseases classified elsewhere Review labs from inpatient hospital admission, which showed anemia.  Will recheck CBC to assess anemia. - CBC  5. GERD without esophagitis Discussed diet to improve GERD symptoms at length. We will continue Protonix 40 mg twice daily before meals. Patient was reminded of the importance of not using nonsteroidal anti-inflammatory medications. Also discussed the importance of refraining from daily alcohol use. Patient states  that he has not had an alcoholic beverage since hospital discharge. - pantoprazole (PROTONIX) 40 MG tablet; Take 1 tablet (40 mg total) by mouth 2 (two) times daily before a meal.  Dispense: 60 tablet; Refill: 2  6. History of GI bleed - sucralfate (CARAFATE) 1 g tablet; Take 1 tablet (1 g total) by mouth 2 (two) times daily.  Dispense: 56 tablet; Refill: 0  7. Need for Tdap vaccination - Tdap vaccine greater than or equal to 7yo IM  8. Influenza vaccination given  - Flu Vaccine QUAD 6+ mos PF IM (Fluarix Quad PF)  9. Language barrier to communication Patient primarily speaks Spanish, utilized video interpreter to assist with communication.  10. Duodenal ulcer EGD showed a nonbleeding duodenal ulcer.  Will continue twice daily Protonix and Carafate twice daily.  Patient will follow-up with gastroenterology as scheduled.    RTC: We will follow-up in 6 months for chronic conditions or as needed   Nolon NationsLaChina Moore Bonni Neuser  MSN, FNP-C [facility] [address]  [place], [place] [id number] [phone]

## 2017-01-27 LAB — HEPATITIS C ANTIBODY
Hepatitis C Ab: NONREACTIVE
SIGNAL TO CUT-OFF: 0.03 (ref <1.00)

## 2017-01-27 LAB — CBC
HCT: 35.7 % — ABNORMAL LOW (ref 38.5–50.0)
HEMOGLOBIN: 12.2 g/dL — AB (ref 13.2–17.1)
MCH: 31.7 pg (ref 27.0–33.0)
MCHC: 34.2 g/dL (ref 32.0–36.0)
MCV: 92.7 fL (ref 80.0–100.0)
MPV: 12.2 fL (ref 7.5–12.5)
Platelets: 186 10*3/uL (ref 140–400)
RBC: 3.85 10*6/uL — AB (ref 4.20–5.80)
RDW: 12.9 % (ref 11.0–15.0)
WBC: 6.2 10*3/uL (ref 3.8–10.8)

## 2017-01-27 LAB — LIPID PANEL
Cholesterol: 144 mg/dL (ref <200)
HDL: 34 mg/dL — ABNORMAL LOW (ref >40)
LDL Cholesterol (Calc): 86 mg/dL (calc)
NON-HDL CHOLESTEROL (CALC): 110 mg/dL (ref <130)
Total CHOL/HDL Ratio: 4.2 (calc) (ref <5.0)
Triglycerides: 137 mg/dL (ref <150)

## 2017-01-28 ENCOUNTER — Encounter: Payer: Self-pay | Admitting: Family Medicine

## 2017-07-27 ENCOUNTER — Ambulatory Visit: Payer: Self-pay | Admitting: Family Medicine

## 2019-09-09 IMAGING — DX DG ABDOMEN 1V
2 series · 2 of 2 positions shown · non-contrast
Comparison: No recent prior.

CLINICAL DATA: Abdominal pain.

EXAM:
ABDOMEN - 1 VIEW

[abdomen kub (1 of 2)]
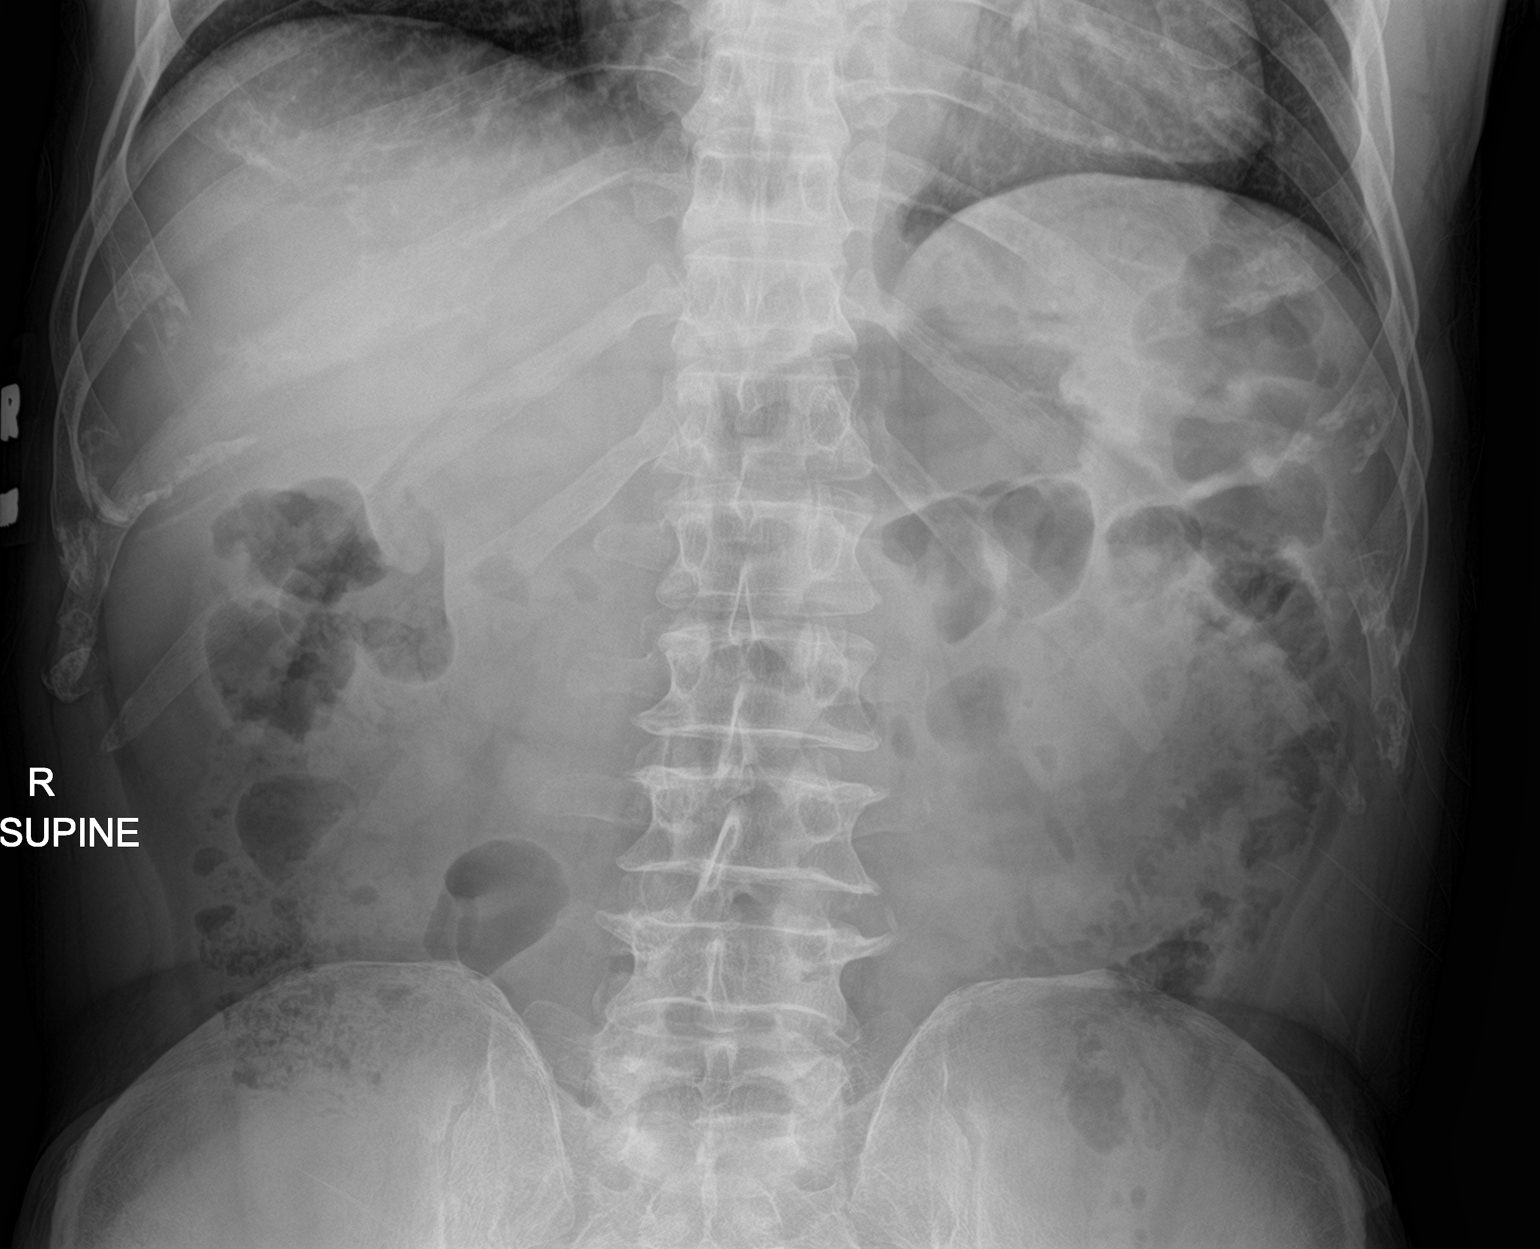

[abdomen kub (2 of 2)]
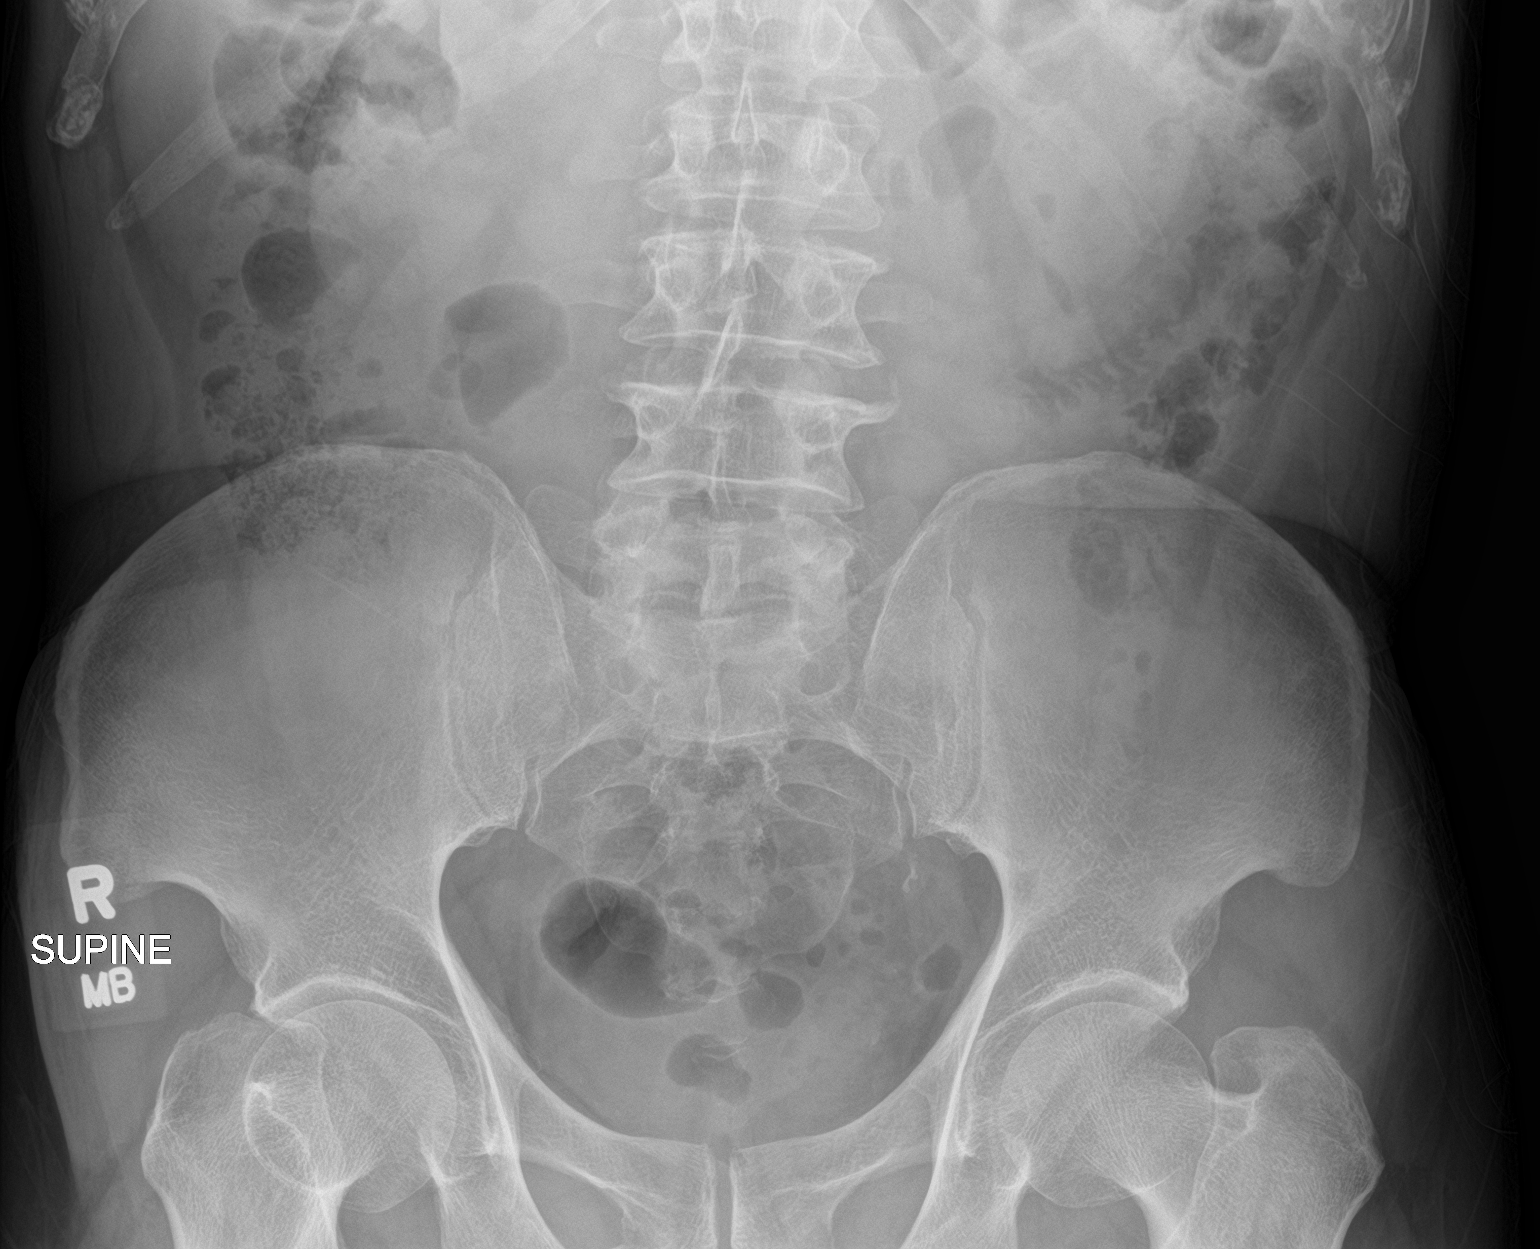

[2 of 2 positions shown; findings below may reference images not displayed]

FINDINGS: Soft tissue structures are unremarkable. No bowel distention. No
free air. No acute bony abnormality. Small calcific density noted
adjacent to the left L4 vertebral body. This may be related to
adjacent lumbar spine degenerative disease. Small left ureteral
stone cannot be excluded. Diffuse prominent degenerative change
lumbar spine. No acute bony abnormality.
IMPRESSION: 1.  No bowel distention or free air.

2. Small calcific density noted adjacent to the left portion of L4,
possibly related to degenerative disease. Small left ureteral stone
cannot be excluded.
